# Patient Record
Sex: Female | Born: 1960
Health system: Southern US, Community
[De-identification: ages and names within clinical notes are randomized; demographics above are authoritative.]

## PROBLEM LIST (undated history)

## (undated) DIAGNOSIS — T7840XA Allergy, unspecified, initial encounter: Secondary | ICD-10-CM

## (undated) DIAGNOSIS — E785 Hyperlipidemia, unspecified: Secondary | ICD-10-CM

## (undated) DIAGNOSIS — M069 Rheumatoid arthritis, unspecified: Secondary | ICD-10-CM

## (undated) DIAGNOSIS — B019 Varicella without complication: Secondary | ICD-10-CM

## (undated) DIAGNOSIS — I82409 Acute embolism and thrombosis of unspecified deep veins of unspecified lower extremity: Secondary | ICD-10-CM

## (undated) DIAGNOSIS — K219 Gastro-esophageal reflux disease without esophagitis: Secondary | ICD-10-CM

## (undated) DIAGNOSIS — I10 Essential (primary) hypertension: Secondary | ICD-10-CM

## (undated) DIAGNOSIS — I2699 Other pulmonary embolism without acute cor pulmonale: Secondary | ICD-10-CM

## (undated) HISTORY — PX: KNEE ARTHROSCOPY: SUR90

## (undated) HISTORY — DX: Allergy, unspecified, initial encounter: T78.40XA

## (undated) HISTORY — DX: Gastro-esophageal reflux disease without esophagitis: K21.9

## (undated) HISTORY — PX: OTHER SURGICAL HISTORY: SHX169

## (undated) HISTORY — DX: Hyperlipidemia, unspecified: E78.5

## (undated) HISTORY — DX: Rheumatoid arthritis, unspecified: M06.9

## (undated) HISTORY — PX: ROTATOR CUFF REPAIR: SHX139

## (undated) HISTORY — DX: Essential (primary) hypertension: I10

## (undated) HISTORY — DX: Other pulmonary embolism without acute cor pulmonale: I26.99

## (undated) HISTORY — DX: Acute embolism and thrombosis of unspecified deep veins of unspecified lower extremity: I82.409

## (undated) HISTORY — DX: Varicella without complication: B01.9

---

## 2001-05-29 HISTORY — PX: FOOT SURGERY: SHX648

## 2009-05-29 DIAGNOSIS — I2699 Other pulmonary embolism without acute cor pulmonale: Secondary | ICD-10-CM

## 2009-05-29 DIAGNOSIS — I82409 Acute embolism and thrombosis of unspecified deep veins of unspecified lower extremity: Secondary | ICD-10-CM

## 2009-05-29 HISTORY — DX: Acute embolism and thrombosis of unspecified deep veins of unspecified lower extremity: I82.409

## 2009-05-29 HISTORY — DX: Other pulmonary embolism without acute cor pulmonale: I26.99

## 2015-10-07 ENCOUNTER — Ambulatory Visit (INDEPENDENT_AMBULATORY_CARE_PROVIDER_SITE_OTHER): Payer: 59 | Admitting: Internal Medicine

## 2015-10-07 ENCOUNTER — Encounter: Payer: Self-pay | Admitting: Internal Medicine

## 2015-10-07 VITALS — BP 120/76 | HR 71 | Temp 97.9°F | Ht 62.5 in | Wt 245.0 lb

## 2015-10-07 DIAGNOSIS — M0579 Rheumatoid arthritis with rheumatoid factor of multiple sites without organ or systems involvement: Secondary | ICD-10-CM

## 2015-10-07 DIAGNOSIS — I10 Essential (primary) hypertension: Secondary | ICD-10-CM

## 2015-10-07 DIAGNOSIS — K219 Gastro-esophageal reflux disease without esophagitis: Secondary | ICD-10-CM | POA: Diagnosis not present

## 2015-10-07 DIAGNOSIS — J302 Other seasonal allergic rhinitis: Secondary | ICD-10-CM | POA: Diagnosis not present

## 2015-10-07 DIAGNOSIS — Z86718 Personal history of other venous thrombosis and embolism: Secondary | ICD-10-CM | POA: Insufficient documentation

## 2015-10-07 DIAGNOSIS — Z86711 Personal history of pulmonary embolism: Secondary | ICD-10-CM

## 2015-10-07 DIAGNOSIS — E785 Hyperlipidemia, unspecified: Secondary | ICD-10-CM

## 2015-10-07 MED ORDER — METHOCARBAMOL 500 MG PO TABS: 500.0000 mg | ORAL_TABLET | Freq: Two times a day (BID) | ORAL | Status: DC

## 2015-10-07 MED ORDER — FOLIC ACID 1 MG PO TABS: 1.0000 mg | ORAL_TABLET | Freq: Every day | ORAL | Status: DC

## 2015-10-07 MED ORDER — CELECOXIB 200 MG PO CAPS: 200.0000 mg | ORAL_CAPSULE | Freq: Every day | ORAL | Status: DC

## 2015-10-07 MED ORDER — METHOTREXATE 2.5 MG PO TABS: 7.5000 mg | ORAL_TABLET | ORAL | Status: DC

## 2015-10-07 NOTE — Patient Instructions (Signed)

## 2015-10-07 NOTE — Assessment & Plan Note (Signed)
She takes Xarelto prn for travel

## 2015-10-07 NOTE — Progress Notes (Signed)
Pre visit review using our clinic review tool, if applicable. No additional management support is needed unless otherwise documented below in the visit note. 

## 2015-10-07 NOTE — Assessment & Plan Note (Signed)
Encouraged her to consume a low fat diet  She had lipid profile checked 04/2015- will ask for copy from previous PCP

## 2015-10-07 NOTE — Assessment & Plan Note (Signed)
She takes Xarelto prn with travel

## 2015-10-07 NOTE — Assessment & Plan Note (Signed)
Celebrex, Methotrexate and Robaxin refilled today She will continue Folic Acid and Enbrel as prescribed Referral to rheum for continuing treatment

## 2015-10-07 NOTE — Assessment & Plan Note (Signed)
Encouraged her to work on diet and exercise 

## 2015-10-07 NOTE — Assessment & Plan Note (Signed)
Controlled on Atenolol and Amlodipine

## 2015-10-07 NOTE — Assessment & Plan Note (Signed)
Currently not an issue Will monitor 

## 2015-10-07 NOTE — Progress Notes (Signed)
HPI  Pt presents to the clinic today to establish care and for management of the conditions listed below. She is transferring care from Dr. Charna Elizabeth, in [place].  Rheumatoid Arthritis: She takes Celebrex and Methotrexate daily. She takes Enbrel injections weekly. She is also taking Folic Acid and Robaxin as instructed. She will need referral to a rheumatologist in the area.  GERD: Currently not an issue.  Seasonal Allergies: Usually worse in the fall. She does not take anything consistently for her allergies.  HTN: Her BP is well controlled on Atenolol and Amlodipine. Her BP today is.  HLD: She denies myalgias on Lipitor. Her cholesterol was last checked. She does try to consume a low fat diet.  History of DVT and PE: She is taking Xarelto as needed for travel. She did do a coagulapathy workup, no disorder noted. She denies any s/s of bleeding. She denies leg swelling, redness, warmth of pain. She denies difficulty breathing or shortness of breath.  Flu: 02/2015 Tetanus: 2012 Pap Smear: 2015 Mammogram: 2015 Colon Screening: 2013 Vision Screening: 04/2015 Dentist: annually  Past Medical History  Diagnosis Date  . Rheumatoid arthritis (HCC)   . Chicken pox   . GERD (gastroesophageal reflux disease)   . Allergy   . Hyperlipidemia   . Hypertension   . DVT (deep venous thrombosis) (HCC) 2011  . PE (pulmonary embolism) 2011   Outpatient Encounter Prescriptions as of 10/07/2015  Medication Sig Note  . amLODipine (NORVASC) 5 MG tablet Take 5 mg by mouth daily. 10/07/2015: Received from: External Pharmacy  . atenolol (TENORMIN) 50 MG tablet Take 50 mg by mouth daily. 10/07/2015: Received from: External Pharmacy  . atorvastatin (LIPITOR) 10 MG tablet Take 10 mg by mouth daily. 10/07/2015: Received from: External Pharmacy  . celecoxib (CELEBREX) 200 MG capsule Take 200 mg by mouth daily. 10/07/2015: Received from: External Pharmacy  . etanercept (ENBREL SURECLICK) 50 MG/ML injection  Inject 50 mg into the skin once a week.   . folic acid (FOLVITE) 1 MG tablet Take 1 mg by mouth daily.   . methocarbamol (ROBAXIN) 500 MG tablet Take 500 mg by mouth 2 (two) times daily.   . methotrexate (RHEUMATREX) 2.5 MG tablet Take 7.5 mg by mouth once a week. Caution:Chemotherapy. Protect from light.   Carlena Hurl 20 MG TABS tablet Take 20 mg by mouth daily. On during travel 10/07/2015: Received from: External Pharmacy   No facility-administered encounter medications on file as of 10/07/2015.     Allergies  Allergen Reactions  . Azithromycin Nausea Only  . Sulfa Antibiotics Hives  . Typhoid Vaccines Other (See Comments)    Pain, inflammation     Family History  Problem Relation Age of Onset  . Arthritis Mother   . Hyperlipidemia Mother   . Hyperlipidemia Father   . Heart disease Father   . Stroke Father   . Diabetes Father   . Hypertension Brother   . Prostate cancer Maternal Uncle   . Heart disease Paternal Aunt   . Stroke Paternal Aunt   . Stroke Paternal Uncle   . Hyperlipidemia Maternal Grandmother   . Hyperlipidemia Maternal Grandfather   . Heart disease Maternal Grandfather   . Stroke Maternal Grandfather   . Hypertension Maternal Grandfather   . Hyperlipidemia Paternal Grandmother   . Diabetes Paternal Grandmother   . Hyperlipidemia Paternal Grandfather     Social History   Social History  . Marital Status: Single    Spouse Name: N/A  . Number of  Children: N/A  . Years of Education: N/A   Occupational History  . Not on file.   Social History Main Topics  . Smoking status: Never Smoker   . Smokeless tobacco: Not on file  . Alcohol Use: Not on file  . Drug Use: Not on file  . Sexual Activity: Not on file   Other Topics Concern  . Not on file   Social History Narrative  . No narrative on file    ROS:  Constitutional: Denies fever, malaise, fatigue, headache or abrupt weight changes.  HEENT: Denies eye pain, eye redness, ear pain, ringing in  the ears, wax buildup, runny nose, nasal congestion, bloody nose, or sore throat. Respiratory: Denies difficulty breathing, shortness of breath, cough or sputum production.   Cardiovascular: Denies chest pain, chest tightness, palpitations or swelling in the hands or feet.  Gastrointestinal: Denies abdominal pain, bloating, constipation, diarrhea or blood in the stool.  GU: Denies frequency, urgency, pain with urination, blood in urine, odor or discharge. Musculoskeletal: Pt reports joint pain and swelling. Denies decrease in range of motion, difficulty with gait, muscle pain.  Skin: Denies redness, rashes, lesions or ulcercations.  Neurological: Denies dizziness, difficulty with memory, difficulty with speech or problems with balance and coordination.  Psych: Denies anxiety, depression, SI/HI.  No other specific complaints in a complete review of systems (except as listed in HPI above).  PE:  BP 120/76 mmHg  Pulse 71  Temp(Src) 97.9 F (36.6 C) (Oral)  Ht 5' 2.5" (1.588 m)  Wt 245 lb (111.131 kg)  BMI 44.07 kg/m2  SpO2 98%  Wt Readings from Last 3 Encounters:  10/07/15 245 lb (111.131 kg)    General: Appears her stated age, obese in NAD. HEENT: Head: normal shape and size; Eyes: sclera white, no icterus, conjunctiva pink, PERRLA and EOMs intact; Ears: Tm's gray and intact, normal light reflex; Throat/Mouth: Teeth present, mucosa pink and moist, no lesions or ulcerations noted.  Cardiovascular: Normal rate and rhythm. S1,S2 noted.  No murmur, rubs or gallops noted.  Pulmonary/Chest: Normal effort and positive vesicular breath sounds. No respiratory distress. No wheezes, rales or ronchi noted.  Abdomen: Soft and nontender.  Musculoskeletal: Strength 5/5 BUE/BLE. No signs of joint swelling. No difficulty with gait.  Neurological: Alert and oriented.  Psychiatric: Mood and affect normal. Behavior is normal. Judgment and thought content normal.     Assessment and Plan:

## 2015-10-12 ENCOUNTER — Telehealth: Payer: Self-pay

## 2015-10-12 NOTE — Telephone Encounter (Signed)
Pt will be traveling to [place] and wants to ck on immunizations; pt should ck with health dept travel advisory or CDC. Left v/m requesting pt to cb.

## 2015-11-16 ENCOUNTER — Other Ambulatory Visit: Payer: Self-pay | Admitting: Internal Medicine

## 2015-12-21 ENCOUNTER — Other Ambulatory Visit: Payer: Self-pay

## 2015-12-21 MED ORDER — ATORVASTATIN CALCIUM 10 MG PO TABS: 10.0000 mg | ORAL_TABLET | Freq: Every day | ORAL | 1 refills | Status: DC

## 2016-01-02 ENCOUNTER — Other Ambulatory Visit: Payer: Self-pay | Admitting: Internal Medicine

## 2016-01-02 DIAGNOSIS — M0579 Rheumatoid arthritis with rheumatoid factor of multiple sites without organ or systems involvement: Secondary | ICD-10-CM

## 2016-01-04 NOTE — Telephone Encounter (Signed)
Are you going to continue to refill the Methotrexate?please advise

## 2016-01-05 NOTE — Telephone Encounter (Signed)
I referred her to Dr. Nickola Major, this should be filled by her

## 2016-03-28 ENCOUNTER — Other Ambulatory Visit: Payer: Self-pay | Admitting: Internal Medicine

## 2016-03-28 DIAGNOSIS — M0579 Rheumatoid arthritis with rheumatoid factor of multiple sites without organ or systems involvement: Secondary | ICD-10-CM

## 2016-03-28 NOTE — Telephone Encounter (Signed)
Electronic refill request. Last Filled:   Folvite  90 tablet 1 10/07/2015  Last Filled:   Robaxin  180 tablet 1 10/07/2015  Last office visit:   10/07/15  NP   Please advise.

## 2016-04-02 ENCOUNTER — Other Ambulatory Visit: Payer: Self-pay | Admitting: Internal Medicine

## 2016-04-02 DIAGNOSIS — M0579 Rheumatoid arthritis with rheumatoid factor of multiple sites without organ or systems involvement: Secondary | ICD-10-CM

## 2016-04-03 NOTE — Telephone Encounter (Signed)
Received refill electronically Last refill 10/07/15 #90/1 Last office visit same date See allergy/contraindication

## 2016-04-04 NOTE — Telephone Encounter (Signed)
Spoken to patient and she stated that she has recently had an appt with [facility]

## 2016-05-01 ENCOUNTER — Other Ambulatory Visit: Payer: Self-pay | Admitting: Primary Care

## 2016-05-01 DIAGNOSIS — M0579 Rheumatoid arthritis with rheumatoid factor of multiple sites without organ or systems involvement: Secondary | ICD-10-CM

## 2016-05-01 NOTE — Telephone Encounter (Signed)
Ok to refill? Electronically refill request for   celecoxib (CELEBREX) 200 MG capsule  Last prescribed by Jae Dire on 04/03/2016. Regina's patient. Last seen on 10/07/2015.

## 2016-05-17 ENCOUNTER — Ambulatory Visit (INDEPENDENT_AMBULATORY_CARE_PROVIDER_SITE_OTHER): Payer: 59 | Admitting: Internal Medicine

## 2016-05-17 ENCOUNTER — Encounter: Payer: Self-pay | Admitting: Internal Medicine

## 2016-05-17 VITALS — BP 118/78 | HR 58 | Temp 97.9°F | Ht 62.5 in | Wt 256.0 lb

## 2016-05-17 DIAGNOSIS — M25511 Pain in right shoulder: Secondary | ICD-10-CM | POA: Diagnosis not present

## 2016-05-17 DIAGNOSIS — Z1231 Encounter for screening mammogram for malignant neoplasm of breast: Secondary | ICD-10-CM

## 2016-05-17 DIAGNOSIS — Z0000 Encounter for general adult medical examination without abnormal findings: Secondary | ICD-10-CM | POA: Diagnosis not present

## 2016-05-17 DIAGNOSIS — Z9889 Other specified postprocedural states: Secondary | ICD-10-CM

## 2016-05-17 DIAGNOSIS — Z23 Encounter for immunization: Secondary | ICD-10-CM | POA: Diagnosis not present

## 2016-05-17 DIAGNOSIS — Z1239 Encounter for other screening for malignant neoplasm of breast: Secondary | ICD-10-CM

## 2016-05-17 DIAGNOSIS — H6123 Impacted cerumen, bilateral: Secondary | ICD-10-CM | POA: Diagnosis not present

## 2016-05-17 LAB — COMPREHENSIVE METABOLIC PANEL
ALT: 26 U/L (ref 0–35)
AST: 24 U/L (ref 0–37)
Albumin: 4 g/dL (ref 3.5–5.2)
Alkaline Phosphatase: 63 U/L (ref 39–117)
BUN: 13 mg/dL (ref 6–23)
CALCIUM: 9.5 mg/dL (ref 8.4–10.5)
CHLORIDE: 104 meq/L (ref 96–112)
CO2: 29 meq/L (ref 19–32)
Creatinine, Ser: 0.91 mg/dL (ref 0.40–1.20)
GFR: 82.54 mL/min (ref >60.00)
GLUCOSE: 87 mg/dL (ref 70–99)
Potassium: 4.4 mEq/L (ref 3.5–5.1)
Sodium: 141 mEq/L (ref 135–145)
Total Bilirubin: 0.5 mg/dL (ref 0.2–1.2)
Total Protein: 7.9 g/dL (ref 6.0–8.3)

## 2016-05-17 LAB — CBC
HCT: 45.6 % (ref 36.0–46.0)
HEMOGLOBIN: 15.2 g/dL — AB (ref 12.0–15.0)
MCHC: 33.3 g/dL (ref 30.0–36.0)
MCV: 92.7 fl (ref 78.0–100.0)
PLATELETS: 249 10*3/uL (ref 150.0–400.0)
RBC: 4.92 Mil/uL (ref 3.87–5.11)
RDW: 14.4 % (ref 11.5–15.5)
WBC: 4.7 10*3/uL (ref 4.0–10.5)

## 2016-05-17 LAB — TSH: TSH: 1.67 u[IU]/mL (ref 0.35–4.50)

## 2016-05-17 LAB — LIPID PANEL
CHOLESTEROL: 218 mg/dL — AB (ref 0–200)
HDL: 85.2 mg/dL (ref >39.00)
LDL CALC: 122 mg/dL — AB (ref 0–99)
NonHDL: 132.45
TRIGLYCERIDES: 54 mg/dL (ref 0.0–149.0)
Total CHOL/HDL Ratio: 3
VLDL: 10.8 mg/dL (ref 0.0–40.0)

## 2016-05-17 LAB — HEMOGLOBIN A1C: Hgb A1c MFr Bld: 5.9 % (ref 4.6–6.5)

## 2016-05-17 NOTE — Patient Instructions (Signed)

## 2016-05-17 NOTE — Progress Notes (Deleted)
Subjective:    Patient ID: Angel Lee, female    DOB: [birth year], 55 y.o.   MRN: [id number]  HPI  Pt presents to the clinic today for his annual exam.  Flu: 02/2015 Tetanus: 2012 Pap Smear: 2015 Mammogram: 2015 Colon Screening: 2013 Vision Screening: 04/2015 Dentist: annually  Diet: Exercise:  Review of Systems      Past Medical History:  Diagnosis Date  . Allergy   . Chicken pox   . DVT (deep venous thrombosis) (HCC) 2011  . GERD (gastroesophageal reflux disease)   . Hyperlipidemia   . Hypertension   . PE (pulmonary embolism) 2011  . Rheumatoid arthritis (HCC)     Current Outpatient Prescriptions  Medication Sig Dispense Refill  . amLODipine (NORVASC) 5 MG tablet Take 5 mg by mouth daily.  3  . atenolol (TENORMIN) 50 MG tablet Take 50 mg by mouth daily.  2  . atorvastatin (LIPITOR) 10 MG tablet Take 1 tablet (10 mg total) by mouth daily. 90 tablet 1  . celecoxib (CELEBREX) 200 MG capsule TAKE ONE CAPSULE BY MOUTH EVERY DAY 30 capsule 4  . etanercept (ENBREL SURECLICK) 50 MG/ML injection Inject 50 mg into the skin once a week.    . folic acid (FOLVITE) 1 MG tablet TAKE 1 TABLET (1 MG TOTAL) BY MOUTH DAILY. 90 tablet 1  . methocarbamol (ROBAXIN) 500 MG tablet TAKE 1 TABLET (500 MG TOTAL) BY MOUTH 2 (TWO) TIMES DAILY. 180 tablet 1  . methotrexate (RHEUMATREX) 2.5 MG tablet Take 3 tablets (7.5 mg total) by mouth once a week. Caution:Chemotherapy. Protect from light. 36 tablet 1  . XARELTO 20 MG TABS tablet Take 20 mg by mouth daily. On during travel  1   No current facility-administered medications for this visit.     Allergies  Allergen Reactions  . Azithromycin Nausea Only  . Sulfa Antibiotics Hives  . Typhoid Vaccines Other (See Comments)    Pain, inflammation     Family History  Problem Relation Age of Onset  . Arthritis Mother   . Hyperlipidemia Mother   . Hyperlipidemia Father   . Heart disease Father   . Stroke Father   . Diabetes Father   .  Hypertension Brother   . Prostate cancer Maternal Uncle   . Heart disease Paternal Aunt   . Stroke Paternal Aunt   . Stroke Paternal Uncle   . Hyperlipidemia Maternal Grandmother   . Hyperlipidemia Maternal Grandfather   . Heart disease Maternal Grandfather   . Stroke Maternal Grandfather   . Hypertension Maternal Grandfather   . Hyperlipidemia Paternal Grandmother   . Diabetes Paternal Grandmother   . Hyperlipidemia Paternal Grandfather     Social History   Social History  . Marital status: Single    Spouse name: N/A  . Number of children: N/A  . Years of education: N/A   Occupational History  . Not on file.   Social History Main Topics  . Smoking status: Never Smoker  . Smokeless tobacco: Not on file  . Alcohol use Not on file  . Drug use: Unknown  . Sexual activity: Not on file   Other Topics Concern  . Not on file   Social History Narrative  . No narrative on file     Constitutional: Denies fever, malaise, fatigue, headache or abrupt weight changes.  HEENT: Denies eye pain, eye redness, ear pain, ringing in the ears, wax buildup, runny nose, nasal congestion, bloody nose, or sore throat. Respiratory: Denies difficulty breathing,  shortness of breath, cough or sputum production.   Cardiovascular: Denies chest pain, chest tightness, palpitations or swelling in the hands or feet.  Gastrointestinal: Denies abdominal pain, bloating, constipation, diarrhea or blood in the stool.  GU: Denies urgency, frequency, pain with urination, burning sensation, blood in urine, odor or discharge. Musculoskeletal: Denies decrease in range of motion, difficulty with gait, muscle pain or joint pain and swelling.  Skin: Denies redness, rashes, lesions or ulcercations.  Neurological: Denies dizziness, difficulty with memory, difficulty with speech or problems with balance and coordination.  Psych: Denies anxiety, depression, SI/HI.  No other specific complaints in a complete review of  systems (except as listed in HPI above).  Objective:   Physical Exam        Assessment & Plan:

## 2016-05-17 NOTE — Progress Notes (Signed)
Subjective:    Patient ID: Angel Lee, female    DOB: [birth year], 55 y.o.   MRN: [id number]  HPI  Pt presents to the clinic today for her annual exam.  Flu: 02/2015 Tetanus: 2012 Pap Smear: 2015 Mammogram: 2015 Colon Screening: 2013 Vision Screening: 04/2015,every 2 years Dentist: annually  Diet: She does eat meat. She consumes fruits and veggies most days of the week. She does eat some fried food. She drinks mostly water, 1 soda daily. Exercise: She is getting her steps in daily, enough to walk 1-2 miles, but not with an elevated heart rate.   Review of Systems      Past Medical History:  Diagnosis Date  . Allergy   . Chicken pox   . DVT (deep venous thrombosis) (HCC) 2011  . GERD (gastroesophageal reflux disease)   . Hyperlipidemia   . Hypertension   . PE (pulmonary embolism) 2011  . Rheumatoid arthritis (HCC)     Current Outpatient Prescriptions  Medication Sig Dispense Refill  . amLODipine (NORVASC) 5 MG tablet Take 5 mg by mouth daily.  3  . atenolol (TENORMIN) 50 MG tablet Take 50 mg by mouth daily.  2  . atorvastatin (LIPITOR) 10 MG tablet Take 1 tablet (10 mg total) by mouth daily. 90 tablet 1  . celecoxib (CELEBREX) 200 MG capsule TAKE ONE CAPSULE BY MOUTH EVERY DAY 30 capsule 4  . etanercept (ENBREL SURECLICK) 50 MG/ML injection Inject 50 mg into the skin once a week.    . folic acid (FOLVITE) 1 MG tablet TAKE 1 TABLET (1 MG TOTAL) BY MOUTH DAILY. 90 tablet 1  . methocarbamol (ROBAXIN) 500 MG tablet TAKE 1 TABLET (500 MG TOTAL) BY MOUTH 2 (TWO) TIMES DAILY. 180 tablet 1  . methotrexate (RHEUMATREX) 2.5 MG tablet Take 3 tablets (7.5 mg total) by mouth once a week. Caution:Chemotherapy. Protect from light. 36 tablet 1  . XARELTO 20 MG TABS tablet Take 20 mg by mouth daily. On during travel  1   No current facility-administered medications for this visit.     Allergies  Allergen Reactions  . Azithromycin Nausea Only  . Sulfa Antibiotics Hives  .  Typhoid Vaccines Other (See Comments)    Pain, inflammation     Family History  Problem Relation Age of Onset  . Arthritis Mother   . Hyperlipidemia Mother   . Hyperlipidemia Father   . Heart disease Father   . Stroke Father   . Diabetes Father   . Hypertension Brother   . Prostate cancer Maternal Uncle   . Heart disease Paternal Aunt   . Stroke Paternal Aunt   . Stroke Paternal Uncle   . Hyperlipidemia Maternal Grandmother   . Hyperlipidemia Maternal Grandfather   . Heart disease Maternal Grandfather   . Stroke Maternal Grandfather   . Hypertension Maternal Grandfather   . Hyperlipidemia Paternal Grandmother   . Diabetes Paternal Grandmother   . Hyperlipidemia Paternal Grandfather     Social History   Social History  . Marital status: Single    Spouse name: N/A  . Number of children: N/A  . Years of education: N/A   Occupational History  . Not on file.   Social History Main Topics  . Smoking status: Never Smoker  . Smokeless tobacco: Never Used  . Alcohol use No  . Drug use: No  . Sexual activity: Not on file   Other Topics Concern  . Not on file   Social History Narrative  .  No narrative on file     Constitutional: Pt reports weight gain. Denies fever, malaise, fatigue, headache.  HEENT: Pt reports wax buildup. Denies eye pain, eye redness, ear pain, ringing in the ears, runny nose, nasal congestion, bloody nose, or sore throat. Respiratory: Denies difficulty breathing, shortness of breath, cough or sputum production.   Cardiovascular: Denies chest pain, chest tightness, palpitations or swelling in the hands or feet.  Gastrointestinal: Pt reports intermittent reflux. Denies abdominal pain, bloating, constipation, diarrhea or blood in the stool.  GU: Denies urgency, frequency, pain with urination, burning sensation, blood in urine, odor or discharge. Musculoskeletal: Pt reports right shoulder pain and weakness. Denies decrease in range of motion, difficulty  with gait, muscle pain or joint swelling.  Skin: Denies redness, rashes, lesions or ulcercations.  Neurological: Denies dizziness, difficulty with memory, difficulty with speech or problems with balance and coordination.  Psych: Denies anxiety, depression, SI/HI.  No other specific complaints in a complete review of systems (except as listed in HPI above).  Objective:   Physical Exam   BP 118/78   Pulse (!) 58   Temp 97.9 F (36.6 C) (Oral)   Ht 5' 2.5" (1.588 m)   Wt 256 lb (116.1 kg)   SpO2 98%   BMI 46.08 kg/m  Wt Readings from Last 3 Encounters:  05/17/16 256 lb (116.1 kg)  10/07/15 245 lb (111.1 kg)    General: Appears her stated age, obese in NAD. Skin: Warm, dry and intact. No rashes, lesions or ulcerations noted. HEENT: Head: normal shape and size; Eyes: sclera white, no icterus, conjunctiva pink, PERRLA and EOMs intact; Ears: Bilateral cerumen impaction; Throat/Mouth: Teeth present, mucosa pink and moist, no exudate, lesions or ulcerations noted.  Neck:  Neck supple, trachea midline. No masses, lumps or thyromegaly present.  Cardiovascular: Normal rate and rhythm. S1,S2 noted.  No murmur, rubs or gallops noted. No JVD or BLE edema.  Pulmonary/Chest: Normal effort and positive vesicular breath sounds. No respiratory distress. No wheezes, rales or ronchi noted.  Abdomen: Soft and nontender. Normal bowel sounds. No distention or masses noted. Liver, spleen and kidneys non palpable due to size. Musculoskeletal: Normal internal and external rotation of the right shoulder. Normal adduction and abduction. No joint swelling noted. Positive drop can test on the right. Strength 4/5 RUE/LUE. Neurological: Alert and oriented.  Psychiatric: Mood and affect normal. Behavior is normal. Judgment and thought content normal.       Assessment & Plan:   Preventative Health Maintenance:  Flu shot today Tetanus UTD Pap Smear due 2020 Mammogram ordered, she will call [facility] to schedule,  number provided Colon Screening UTD Encouraged her to consume a balanced diet and exercise regimen Advised her to to see an eye doctor and dentist annually Will check CBC, CMET, Lipid, TSH and A1C today  Bilateral Cerumen Impaction:  Manual lavage by CMA Advised her to try Debrox OTC 2 x week to prevent wax buildup  Right shoulder pain with history of previous rotator cuff repair:  Referral to ortho for further evaluation and treatment  RTC in 1 year, sooner if needed Nicki Reaper, NP

## 2016-05-19 MED ORDER — ATORVASTATIN CALCIUM 20 MG PO TABS: 20.0000 mg | ORAL_TABLET | Freq: Every day | ORAL | 1 refills | Status: DC

## 2016-05-19 NOTE — Addendum Note (Signed)
Addended by: Roena Malady on: 05/19/2016 09:26 AM   Modules accepted: Orders

## 2016-06-06 DIAGNOSIS — M25511 Pain in right shoulder: Secondary | ICD-10-CM | POA: Diagnosis not present

## 2016-06-13 NOTE — Addendum Note (Signed)
Addended by: Roena Malady on: 06/13/2016 04:28 PM   Modules accepted: Orders

## 2016-06-20 NOTE — Telephone Encounter (Signed)
Pt was seen 05/17/16 for annual.

## 2016-06-29 DIAGNOSIS — M0609 Rheumatoid arthritis without rheumatoid factor, multiple sites: Secondary | ICD-10-CM | POA: Diagnosis not present

## 2016-06-29 DIAGNOSIS — M2550 Pain in unspecified joint: Secondary | ICD-10-CM | POA: Diagnosis not present

## 2016-06-29 DIAGNOSIS — M150 Primary generalized (osteo)arthritis: Secondary | ICD-10-CM | POA: Diagnosis not present

## 2016-06-29 DIAGNOSIS — Z79899 Other long term (current) drug therapy: Secondary | ICD-10-CM | POA: Diagnosis not present

## 2016-07-24 ENCOUNTER — Other Ambulatory Visit: Payer: Self-pay | Admitting: Internal Medicine

## 2016-08-10 ENCOUNTER — Emergency Department (HOSPITAL_COMMUNITY)
Admission: EM | Admit: 2016-08-10 | Discharge: 2016-08-10 | Disposition: A | Discharge status: Home / Self Care | Payer: 59 | Attending: Emergency Medicine | Admitting: Emergency Medicine

## 2016-08-10 ENCOUNTER — Emergency Department (HOSPITAL_BASED_OUTPATIENT_CLINIC_OR_DEPARTMENT_OTHER): Payer: 59

## 2016-08-10 DIAGNOSIS — I10 Essential (primary) hypertension: Secondary | ICD-10-CM | POA: Insufficient documentation

## 2016-08-10 DIAGNOSIS — M79609 Pain in unspecified limb: Secondary | ICD-10-CM

## 2016-08-10 DIAGNOSIS — M0609 Rheumatoid arthritis without rheumatoid factor, multiple sites: Secondary | ICD-10-CM | POA: Diagnosis not present

## 2016-08-10 DIAGNOSIS — M79604 Pain in right leg: Secondary | ICD-10-CM

## 2016-08-10 DIAGNOSIS — M79661 Pain in right lower leg: Secondary | ICD-10-CM | POA: Insufficient documentation

## 2016-08-10 DIAGNOSIS — M25561 Pain in right knee: Secondary | ICD-10-CM | POA: Diagnosis not present

## 2016-08-10 LAB — BASIC METABOLIC PANEL
ANION GAP: 8 (ref 5–15)
BUN: 11 mg/dL (ref 6–20)
CHLORIDE: 105 mmol/L (ref 101–111)
CO2: 26 mmol/L (ref 22–32)
Calcium: 9.1 mg/dL (ref 8.9–10.3)
Creatinine, Ser: 0.99 mg/dL (ref 0.44–1.00)
GFR calc Af Amer: >60 mL/min (ref >60)
GFR calc non Af Amer: >60 mL/min (ref >60)
Glucose, Bld: 107 mg/dL — ABNORMAL HIGH (ref 65–99)
Potassium: 4.8 mmol/L (ref 3.5–5.1)
SODIUM: 139 mmol/L (ref 135–145)

## 2016-08-10 LAB — DIFFERENTIAL
Basophils Absolute: 0 10*3/uL (ref 0.0–0.1)
Basophils Relative: 0 %
EOS ABS: 0.1 10*3/uL (ref 0.0–0.7)
EOS PCT: 3 %
LYMPHS ABS: 2.1 10*3/uL (ref 0.7–4.0)
LYMPHS PCT: 48 %
MONO ABS: 0.4 10*3/uL (ref 0.1–1.0)
Monocytes Relative: 10 %
NEUTROS PCT: 39 %
Neutro Abs: 1.7 10*3/uL (ref 1.7–7.7)

## 2016-08-10 LAB — CBC
HEMATOCRIT: 43.6 % (ref 36.0–46.0)
Hemoglobin: 14 g/dL (ref 12.0–15.0)
MCH: 30.6 pg (ref 26.0–34.0)
MCHC: 32.1 g/dL (ref 30.0–36.0)
MCV: 95.4 fL (ref 78.0–100.0)
Platelets: 228 10*3/uL (ref 150–400)
RBC: 4.57 MIL/uL (ref 3.87–5.11)
RDW: 14.7 % (ref 11.5–15.5)
WBC: 4.3 10*3/uL (ref 4.0–10.5)

## 2016-08-10 NOTE — ED Provider Notes (Signed)
[facility]-EMERGENCY DEPT Provider Note   CSN: [id number] Arrival date & time: 08/10/16  0148     History   Chief Complaint No chief complaint on file.   HPI Angel Lee is a 56 y.o. female.  The history is provided by the patient.  Leg Pain   This is a recurrent problem. The current episode started 2 days ago. The problem occurs constantly. Progression since onset: fluctuating. The pain is present in the right lower leg. The quality of the pain is described as aching. Pertinent negatives include no numbness, full range of motion and no stiffness. She has tried nothing for the symptoms. There has been no history of extremity trauma. Family history is significant for rheumatoid arthritis.   Does have a h/o DVT/PE on Xarelto (only prior to travel as she travels frequently). Pain is similar to RA, but usually pain goes away. Since it's been on going for a few days, she is concerned it might be a DVT.   Past Medical History:  Diagnosis Date  . Allergy   . Chicken pox   . DVT (deep venous thrombosis) (HCC) 2011  . GERD (gastroesophageal reflux disease)   . Hyperlipidemia   . Hypertension   . PE (pulmonary embolism) 2011  . Rheumatoid arthritis [facility])     Patient Active Problem List   Diagnosis Date Noted  . Rheumatoid arthritis involving multiple sites with positive rheumatoid factor (HCC) 10/07/2015  . GERD (gastroesophageal reflux disease) 10/07/2015  . Seasonal allergies 10/07/2015  . HTN (hypertension) 10/07/2015  . HLD (hyperlipidemia) 10/07/2015  . History of DVT (deep vein thrombosis) 10/07/2015  . History of pulmonary embolism 10/07/2015  . Severe obesity (BMI >= 40) (HCC) 10/07/2015    Past Surgical History:  Procedure Laterality Date  . FOOT SURGERY Right 2003  . KNEE ARTHROSCOPY Bilateral 1981, 1998, 2007  . ROTATOR CUFF REPAIR Bilateral 2000, 2005  . wisdom teeth      OB History    No data available       Home Medications    Prior to Admission  medications   Medication Sig Start Date End Date Taking? Authorizing Provider  amLODipine (NORVASC) 5 MG tablet Take 1 tablet (5 mg total) by mouth daily. 07/25/16  Yes Lorre Munroe, NP  atenolol (TENORMIN) 50 MG tablet Take 50 mg by mouth daily. 07/10/15  Yes Historical Provider, MD  atorvastatin (LIPITOR) 20 MG tablet Take 1 tablet (20 mg total) by mouth daily. 05/19/16  Yes Lorre Munroe, NP  celecoxib (CELEBREX) 200 MG capsule TAKE ONE CAPSULE BY MOUTH EVERY DAY 05/01/16  Yes Lorre Munroe, NP  etanercept (ENBREL SURECLICK) 50 MG/ML injection Inject 50 mg into the skin once a week.   Yes Historical Provider, MD  folic acid (FOLVITE) 1 MG tablet TAKE 1 TABLET (1 MG TOTAL) BY MOUTH DAILY. 03/29/16  Yes Lorre Munroe, NP  methocarbamol (ROBAXIN) 500 MG tablet TAKE 1 TABLET (500 MG TOTAL) BY MOUTH 2 (TWO) TIMES DAILY. 03/29/16  Yes Lorre Munroe, NP  methotrexate (RHEUMATREX) 2.5 MG tablet Take 3 tablets (7.5 mg total) by mouth once a week. Caution:Chemotherapy. Protect from light. Patient taking differently: Take 15 mg by mouth once a week. Caution:Chemotherapy. Protect from light. 10/07/15  Yes Lorre Munroe, NP  ranitidine (ZANTAC) 150 MG tablet Take 150 mg by mouth every morning.   Yes Historical Provider, MD  XARELTO 20 MG TABS tablet Take 20 mg by mouth See admin instructions. On during travel 09/11/15  Yes Historical Provider, MD    Family History Family History  Problem Relation Age of Onset  . Arthritis Mother   . Hyperlipidemia Mother   . Hyperlipidemia Father   . Heart disease Father   . Stroke Father   . Diabetes Father   . Hypertension Brother   . Prostate cancer Maternal Uncle   . Heart disease Paternal Aunt   . Stroke Paternal Aunt   . Stroke Paternal Uncle   . Hyperlipidemia Maternal Grandmother   . Hyperlipidemia Maternal Grandfather   . Heart disease Maternal Grandfather   . Stroke Maternal Grandfather   . Hypertension Maternal Grandfather   . Hyperlipidemia  Paternal Grandmother   . Diabetes Paternal Grandmother   . Hyperlipidemia Paternal Grandfather     Social History Social History  Substance Use Topics  . Smoking status: Never Smoker  . Smokeless tobacco: Never Used  . Alcohol use No     Allergies   Azithromycin; Sulfa antibiotics; and Typhoid vaccines   Review of Systems Review of Systems  Musculoskeletal: Negative for stiffness.  Neurological: Negative for numbness.   Ten systems are reviewed and are negative for acute change except as noted in the HPI  Physical Exam Updated Vital Signs BP 138/83   Pulse 87   Resp 18   SpO2 96%   Physical Exam  Constitutional: She is oriented to person, place, and time. She appears well-developed and well-nourished. No distress.  HENT:  Head: Normocephalic and atraumatic.  Nose: Nose normal.  Eyes: Conjunctivae and EOM are normal. Pupils are equal, round, and reactive to light. Right eye exhibits no discharge. Left eye exhibits no discharge. No scleral icterus.  Neck: Normal range of motion. Neck supple.  Cardiovascular: Normal rate and regular rhythm.  Exam reveals no gallop and no friction rub.   No murmur heard. Pulmonary/Chest: Effort normal and breath sounds normal. No stridor. No respiratory distress. She has no rales.  Abdominal: Soft. She exhibits no distension. There is no tenderness.  Musculoskeletal: She exhibits no edema.       Right knee: Tenderness (mild discomfort to posterior aspect) found.  Neurological: She is alert and oriented to person, place, and time.  Skin: Skin is warm and dry. No rash noted. She is not diaphoretic. No erythema.  Psychiatric: She has a normal mood and affect.  Vitals reviewed.    ED Treatments / Results  Labs (all labs ordered are listed, but only abnormal results are displayed) Labs Reviewed  BASIC METABOLIC PANEL - Abnormal; Notable for the following:       Result Value   Glucose, Bld 107 (*)    All other components within normal  limits  CBC  DIFFERENTIAL  CBC WITH DIFFERENTIAL/PLATELET    EKG  EKG Interpretation None       Radiology No results found.  Procedures Procedures (including critical care time)  Medications Ordered in ED Medications - No data to display   Initial Impression / Assessment and Plan / ED Course  I have reviewed the triage vital signs and the nursing notes.  Pertinent labs & imaging results that were available during my care of the patient were reviewed by me and considered in my medical decision making (see chart for details).     No evidence of cellulitis. Labs reassuring. Will obtain US to assess for DVT. If negative, she is clear for discharge. If positive, she will need to take daily Xarelto and follow up with PCP.  Patient care turned over to  Dr Patria Mane at 0700. Patient case and results discussed in detail; please see their note for further ED managment.      Final Clinical Impressions(s) / ED Diagnoses   Final diagnoses:  Right leg pain     Nira Conn, MD 08/10/16 [phone]

## 2016-08-10 NOTE — ED Provider Notes (Signed)
VASCULAR LAB PRELIMINARY  PRELIMINARY  PRELIMINARY  PRELIMINARY  Right lower extremity venous duplex completed.    Preliminary report:  There is no DVT or SVT noted in the right lower extremity.  Called results to Dr. Orlean Bradford, [facility], RVT 08/10/2016, 9:01 AM   Azalia Bilis, MD 08/10/16 [phone]

## 2016-08-10 NOTE — ED Notes (Signed)
See downtime record

## 2016-08-10 NOTE — ED Notes (Signed)
Patient transported to Vascular Ultrasound °

## 2016-08-10 NOTE — Progress Notes (Signed)
VASCULAR LAB PRELIMINARY  PRELIMINARY  PRELIMINARY  PRELIMINARY  Right lower extremity venous duplex completed.    Preliminary report:  There is no DVT or SVT noted in the right lower extremity.  Called results to Dr. Orlean Bradford, [facility], RVT 08/10/2016, 9:01 AM

## 2016-08-15 ENCOUNTER — Ambulatory Visit (INDEPENDENT_AMBULATORY_CARE_PROVIDER_SITE_OTHER): Payer: 59 | Admitting: Internal Medicine

## 2016-08-15 ENCOUNTER — Encounter: Payer: Self-pay | Admitting: Internal Medicine

## 2016-08-15 VITALS — BP 106/68 | HR 72 | Temp 97.6°F | Wt 258.8 lb

## 2016-08-15 DIAGNOSIS — M79661 Pain in right lower leg: Secondary | ICD-10-CM

## 2016-08-15 NOTE — Progress Notes (Signed)
Subjective:    Patient ID: Angel Lee, female    DOB: [birth year], 56 y.o.   MRN: [id number]  HPI  Pt presents to the clinic today for ER follow up. She went to the ER with c/po right leg pain. She reports the pain started. She describes the pain as achy but varying in intensity. Given her history of DVT, they did a RLE doppler which was negative. She was discharged home and advised to follow up with her PCP. Since discharge, she reports she has had some improvement. She has tried stretching it. She has not noticed any swelling, pain or warmth. She continues to take Xarelto. She denies any injury to the area. She continues to have joint stiffness, but has a history of RA and is on MTX.  Review of Systems  Past Medical History:  Diagnosis Date  . Allergy   . Chicken pox   . DVT (deep venous thrombosis) (HCC) 2011  . GERD (gastroesophageal reflux disease)   . Hyperlipidemia   . Hypertension   . PE (pulmonary embolism) 2011  . Rheumatoid arthritis (HCC)     Current Outpatient Prescriptions  Medication Sig Dispense Refill  . amLODipine (NORVASC) 5 MG tablet Take 1 tablet (5 mg total) by mouth daily. 30 tablet 6  . atenolol (TENORMIN) 50 MG tablet Take 50 mg by mouth daily.  2  . atorvastatin (LIPITOR) 20 MG tablet Take 1 tablet (20 mg total) by mouth daily. 90 tablet 1  . celecoxib (CELEBREX) 200 MG capsule TAKE ONE CAPSULE BY MOUTH EVERY DAY 30 capsule 4  . etanercept (ENBREL SURECLICK) 50 MG/ML injection Inject 50 mg into the skin once a week.    . folic acid (FOLVITE) 1 MG tablet TAKE 1 TABLET (1 MG TOTAL) BY MOUTH DAILY. 90 tablet 1  . methocarbamol (ROBAXIN) 500 MG tablet TAKE 1 TABLET (500 MG TOTAL) BY MOUTH 2 (TWO) TIMES DAILY. 180 tablet 1  . methotrexate (RHEUMATREX) 2.5 MG tablet Take 3 tablets (7.5 mg total) by mouth once a week. Caution:Chemotherapy. Protect from light. (Patient taking differently: Take 15 mg by mouth once a week. Caution:Chemotherapy. Protect from light.)  36 tablet 1  . ranitidine (ZANTAC) 150 MG tablet Take 150 mg by mouth every morning.    Carlena Hurl 20 MG TABS tablet Take 20 mg by mouth See admin instructions. On during travel  1   No current facility-administered medications for this visit.     Allergies  Allergen Reactions  . Azithromycin Nausea Only  . Sulfa Antibiotics Hives  . Typhoid Vaccines Other (See Comments)    Pain, inflammation     Family History  Problem Relation Age of Onset  . Arthritis Mother   . Hyperlipidemia Mother   . Hyperlipidemia Father   . Heart disease Father   . Stroke Father   . Diabetes Father   . Hypertension Brother   . Prostate cancer Maternal Uncle   . Heart disease Paternal Aunt   . Stroke Paternal Aunt   . Stroke Paternal Uncle   . Hyperlipidemia Maternal Grandmother   . Hyperlipidemia Maternal Grandfather   . Heart disease Maternal Grandfather   . Stroke Maternal Grandfather   . Hypertension Maternal Grandfather   . Hyperlipidemia Paternal Grandmother   . Diabetes Paternal Grandmother   . Hyperlipidemia Paternal Grandfather     Social History   Social History  . Marital status: Single    Spouse name: N/A  . Number of children: N/A  . Years  of education: N/A   Occupational History  . Not on file.   Social History Main Topics  . Smoking status: Never Smoker  . Smokeless tobacco: Never Used  . Alcohol use No  . Drug use: No  . Sexual activity: Not on file   Other Topics Concern  . Not on file   Social History Narrative  . No narrative on file     Constitutional: Denies fever, malaise, fatigue, headache or abrupt weight changes.  Respiratory: Denies difficulty breathing, shortness of breath, cough or sputum production.   Cardiovascular: Denies chest pain, chest tightness, palpitations or swelling in the hands or feet.  Musculoskeletal: Pt reports right leg pain. Denies decrease in range of motion, difficulty with gait, or joint pain and swelling.  Skin: Denies  redness, rashes, lesions or ulcercations.   No other specific complaints in a complete review of systems (except as listed in HPI above).     Objective:   Physical Exam   BP 106/68   Pulse 72   Temp 97.6 F (36.4 C) (Oral)   Wt 258 lb 12 oz (117.4 kg)   SpO2 97%   BMI 46.57 kg/m  Wt Readings from Last 3 Encounters:  08/15/16 258 lb 12 oz (117.4 kg)  05/17/16 256 lb (116.1 kg)  10/07/15 245 lb (111.1 kg)    General: Appears her stated age, obese in NAD. Skin: Warm, dry and intact. No redness or warmth noted. Cardiovascular: Trace pitting edema BLE. Pulmonary/Chest: Normal effort and positive vesicular breath sounds. No respiratory distress. No wheezes, rales or ronchi noted.  Musculoskeletal: Pain with palpation of the right gastrocnemius. No difficulty with gait.    BMET    Component Value Date/Time   NA 139 08/10/2016 0215   K 4.8 08/10/2016 0215   CL 105 08/10/2016 0215   CO2 26 08/10/2016 0215   GLUCOSE 107 (H) 08/10/2016 0215   BUN 11 08/10/2016 0215   CREATININE 0.99 08/10/2016 0215   CALCIUM 9.1 08/10/2016 0215   GFRNONAA >60 08/10/2016 0215   GFRAA >60 08/10/2016 0215    Lipid Panel     Component Value Date/Time   CHOL 218 (H) 05/17/2016 1414   TRIG 54.0 05/17/2016 1414   HDL 85.20 05/17/2016 1414   CHOLHDL 3 05/17/2016 1414   VLDL 10.8 05/17/2016 1414   LDLCALC 122 (H) 05/17/2016 1414    CBC    Component Value Date/Time   WBC 4.3 08/10/2016 0215   RBC 4.57 08/10/2016 0215   HGB 14.0 08/10/2016 0215   HCT 43.6 08/10/2016 0215   PLT 228 08/10/2016 0215   MCV 95.4 08/10/2016 0215   MCH 30.6 08/10/2016 0215   MCHC 32.1 08/10/2016 0215   RDW 14.7 08/10/2016 0215   LYMPHSABS 2.1 08/10/2016 0215   MONOABS 0.4 08/10/2016 0215   EOSABS 0.1 08/10/2016 0215   BASOSABS 0.0 08/10/2016 0215    Hgb A1C Lab Results  Component Value Date   HGBA1C 5.9 05/17/2016           Assessment & Plan:   ER follow up for right leg pain:  ER notes,  labs and imaging reviewed Seems MSK in nature eRx for Aspercream Stretching exercises given A heating pad may be helpful  RTC as needed or if symptoms persist or worsen Darrly Loberg, NP

## 2016-08-15 NOTE — Patient Instructions (Signed)
Medial Head Gastrocnemius Tear Rehab Ask your health care provider which exercises are safe for you. Do exercises exactly as told by your health care provider and adjust them as directed. It is normal to feel mild stretching, pulling, tightness, or discomfort as you do these exercises, but you should stop right away if you feel sudden pain or your pain gets worse.Do not begin these exercises until told by your health care provider. Stretching and range of motion exercises These exercises warm up your muscles and joints and improve the movement and flexibility of your lower leg. These exercises also help to relieve pain and stiffness. Exercise A: Gastrocnemius stretch   1. Sit with your left / right leg extended. 2. Loop a belt or towel around the ball of your left / right foot. The ball of your foot is on the walking surface, right under your toes. 3. Hold both ends of the belt or towel. 4. Keep your left / right ankle and foot relaxed and keep your knee straight while you use the belt or towel to pull your foot and ankle toward you. Stop at the first point of resistance. 5. Hold this position for __________ seconds. Repeat __________ times. Complete this exercise __________ times a day. Exercise B: Ankle alphabet   1. Sit with your left / right leg supported at the lower leg.  Do not rest your foot on anything.  Make sure your foot has room to move freely. 2. Think of your left / right foot as a paintbrush, and move your foot to trace each letter of the alphabet in the air. Keep your hip and knee still while you trace. 3. Trace every letter from A to Z. Repeat __________ times. Complete this exercise __________ times a day. Strengthening exercises These exercises build strength and endurance in your lower leg. Endurance is the ability to use your muscles for a long time, even after they get tired. Exercise C: Plantar flexors with band   1. Sit with your left / right leg extended. 2. Loop  a rubber exercise band or tube around the ball of your left / right foot. The ball of your foot is on the walking surface, right under your toes. 3. While holding both ends of the band or tube, slowly point your toes downward, pushing them away from you. 4. Hold this position for __________ seconds. 5. Slowly return your foot to the starting position. Repeat __________ times. Complete this exercise __________ times a day. Exercise D: Plantar flexors, standing   1. Stand with your feet shoulder-width apart. 2. Place your hands on a wall or table to steady yourself as needed, but try not to use it very much for support. 3. Rise up on your toes. 4. If this exercise is too easy, try these options:  Shift your weight toward your left / right leg until you feel challenged.  If told by your health care provider, stand on your left / right foot only. 5. Hold this position for __________ seconds. Repeat __________ times. Complete this exercise __________ times a day. Exercise E: Plantar flexors, eccentric   1. Stand on the balls of your feet on the edge of a step. The ball of your foot is on the walking surface, right under your toes. 2. Place your hands on a wall or railing for balance as needed, but try not to lean on it for support. 3. Rise up on your toes, using both legs to help. 4. Slowly shift all   of your weight to your left / right foot and lift your other foot off the step. 5. Slowly lower your left / right heel so it drops below the level of the step. You will feel a slight stretch in your left / right calf. 6. Put your other foot back onto the step. Repeat __________ times. Complete this exercise __________ times a day. This information is not intended to replace advice given to you by your health care provider. Make sure you discuss any questions you have with your health care provider. Document Released: 05/15/2005 Document Revised: 01/20/2016 Document Reviewed: 04/27/2015 [employer]vier  Interactive Patient Education  2017 [employer]  

## 2016-08-31 DIAGNOSIS — M791 Myalgia: Secondary | ICD-10-CM | POA: Diagnosis not present

## 2016-08-31 DIAGNOSIS — M0609 Rheumatoid arthritis without rheumatoid factor, multiple sites: Secondary | ICD-10-CM | POA: Diagnosis not present

## 2016-08-31 DIAGNOSIS — M2550 Pain in unspecified joint: Secondary | ICD-10-CM | POA: Diagnosis not present

## 2016-08-31 DIAGNOSIS — M150 Primary generalized (osteo)arthritis: Secondary | ICD-10-CM | POA: Diagnosis not present

## 2016-09-13 ENCOUNTER — Other Ambulatory Visit: Payer: Self-pay

## 2016-09-13 DIAGNOSIS — M0579 Rheumatoid arthritis with rheumatoid factor of multiple sites without organ or systems involvement: Secondary | ICD-10-CM

## 2016-09-13 MED ORDER — CELECOXIB 200 MG PO CAPS: 200.0000 mg | ORAL_CAPSULE | Freq: Every day | ORAL | 1 refills | Status: DC

## 2016-09-20 ENCOUNTER — Other Ambulatory Visit: Payer: Self-pay | Admitting: Internal Medicine

## 2016-09-20 DIAGNOSIS — M0579 Rheumatoid arthritis with rheumatoid factor of multiple sites without organ or systems involvement: Secondary | ICD-10-CM

## 2016-09-20 NOTE — Telephone Encounter (Signed)
Last filled 03/2016 90 day supply with 1 refill please advise

## 2016-09-23 ENCOUNTER — Other Ambulatory Visit: Payer: Self-pay

## 2016-09-23 NOTE — Telephone Encounter (Signed)
I do not see where you have ever filled this medication.... please advise 

## 2016-09-25 MED ORDER — XARELTO 20 MG PO TABS: 20.0000 mg | ORAL_TABLET | ORAL | 1 refills | Status: DC

## 2016-09-29 ENCOUNTER — Encounter: Payer: Self-pay | Admitting: Internal Medicine

## 2016-09-29 ENCOUNTER — Ambulatory Visit (INDEPENDENT_AMBULATORY_CARE_PROVIDER_SITE_OTHER): Payer: 59 | Admitting: Internal Medicine

## 2016-09-29 VITALS — BP 110/78 | HR 63 | Temp 98.0°F | Wt 260.0 lb

## 2016-09-29 DIAGNOSIS — M069 Rheumatoid arthritis, unspecified: Secondary | ICD-10-CM

## 2016-09-29 DIAGNOSIS — K219 Gastro-esophageal reflux disease without esophagitis: Secondary | ICD-10-CM | POA: Diagnosis not present

## 2016-09-29 DIAGNOSIS — M79661 Pain in right lower leg: Secondary | ICD-10-CM

## 2016-09-29 MED ORDER — DEXLANSOPRAZOLE 30 MG PO CPDR: 30.0000 mg | DELAYED_RELEASE_CAPSULE | Freq: Every day | ORAL | 2 refills | Status: DC

## 2016-09-29 NOTE — Progress Notes (Signed)
Subjective:    Patient ID: Angel Lee, female    DOB: 12-19-1960, 56 y.o.   MRN: [id number]  HPI  Pt presents to the clinic today with c/o persistent leg pain. She was seen 3/15 and 3/20 for the same. She describes the pain as sore and achy. It is located mainly in the calf. She denies swelling or redness. The pain is worse with weight bearing. She denies numbness or tingling in the calf. She has had a negative venous doppler of the RLE. She is requesting a referral to PT at this time.  She also wants to let me know that her rheumatologist stopper her MTX and Enbrel because her rheumatoid factor was negative.Marland Kitchen She was switched to Prednisone. She reports the achyness in her joints and muscles pains have flared up since this time. She is requesting to be put back on her MTX and Enbrel. She follows up with rheumatology in 2-3 weeks.  She also reports that her reflux is worse. She is not sure what is triggering this. She is taking Zantac 2 x day, but reports it is no longer effective. She has failed Nexium and Prilosec in the past. She is requesting a RX for Dexilant, which she has been on in the past and worked very well for her. She may also need a referral to GI  But wants to hold off on this for now.  Review of Systems      Past Medical History:  Diagnosis Date  . Allergy   . Chicken pox   . DVT (deep venous thrombosis) (HCC) 2011  . GERD (gastroesophageal reflux disease)   . Hyperlipidemia   . Hypertension   . PE (pulmonary embolism) 2011  . Rheumatoid arthritis (HCC)     Current Outpatient Prescriptions  Medication Sig Dispense Refill  . amLODipine (NORVASC) 5 MG tablet Take 1 tablet (5 mg total) by mouth daily. 30 tablet 6  . atenolol (TENORMIN) 50 MG tablet Take 50 mg by mouth daily.  2  . atorvastatin (LIPITOR) 20 MG tablet Take 1 tablet (20 mg total) by mouth daily. 90 tablet 1  . celecoxib (CELEBREX) 200 MG capsule Take 1 capsule (200 mg total) by mouth daily. 90 capsule  1  . etanercept (ENBREL SURECLICK) 50 MG/ML injection Inject 50 mg into the skin once a week.    . folic acid (FOLVITE) 1 MG tablet TAKE 1 TABLET (1 MG TOTAL) BY MOUTH DAILY. 90 tablet 1  . methocarbamol (ROBAXIN) 500 MG tablet TAKE 1 TABLET (500 MG TOTAL) BY MOUTH 2 (TWO) TIMES DAILY. 180 tablet 1  . methotrexate (RHEUMATREX) 2.5 MG tablet Take 3 tablets (7.5 mg total) by mouth once a week. Caution:Chemotherapy. Protect from light. (Patient taking differently: Take 15 mg by mouth once a week. Caution:Chemotherapy. Protect from light.) 36 tablet 1  . ranitidine (ZANTAC) 150 MG tablet Take 150 mg by mouth every morning.    Carlena Hurl 20 MG TABS tablet Take 1 tablet (20 mg total) by mouth See admin instructions. On during travel 30 tablet 1   No current facility-administered medications for this visit.     Allergies  Allergen Reactions  . Azithromycin Nausea Only  . Sulfa Antibiotics Hives  . Typhoid Vaccines Other (See Comments)    Pain, inflammation     Family History  Problem Relation Age of Onset  . Arthritis Mother   . Hyperlipidemia Mother   . Hyperlipidemia Father   . Heart disease Father   . Stroke  Father   . Diabetes Father   . Hypertension Brother   . Prostate cancer Maternal Uncle   . Heart disease Paternal Aunt   . Stroke Paternal Aunt   . Stroke Paternal Uncle   . Hyperlipidemia Maternal Grandmother   . Hyperlipidemia Maternal Grandfather   . Heart disease Maternal Grandfather   . Stroke Maternal Grandfather   . Hypertension Maternal Grandfather   . Hyperlipidemia Paternal Grandmother   . Diabetes Paternal Grandmother   . Hyperlipidemia Paternal Grandfather     Social History   Social History  . Marital status: Single    Spouse name: N/A  . Number of children: N/A  . Years of education: N/A   Occupational History  . Not on file.   Social History Main Topics  . Smoking status: Never Smoker  . Smokeless tobacco: Never Used  . Alcohol use No  . Drug  use: No  . Sexual activity: Not on file   Other Topics Concern  . Not on file   Social History Narrative  . No narrative on file     Constitutional: Denies fever, malaise, fatigue, headache or abrupt weight changes.  Respiratory: Denies difficulty breathing, shortness of breath, cough or sputum production.   Cardiovascular: Denies chest pain, chest tightness, palpitations or swelling in the hands or feet.  Gastrointestinal: Pt reports reflux. Denies abdominal pain, bloating, constipation, diarrhea or blood in the stool.  Musculoskeletal: Pt reports muscle and joint pain. Denies decrease in range of motion, difficulty with gait, or joint swelling.  Neurological: Denies dizziness, difficulty with memory, difficulty with speech or problems with balance and coordination.    No other specific complaints in a complete review of systems (except as listed in HPI above).  Objective:   Physical Exam  BP 110/78   Pulse 63   Temp 98 F (36.7 C) (Oral)   Wt 260 lb (117.9 kg)   SpO2 97%   BMI 46.80 kg/m  Wt Readings from Last 3 Encounters:  09/29/16 260 lb (117.9 kg)  08/15/16 258 lb 12 oz (117.4 kg)  05/17/16 256 lb (116.1 kg)    General: Appears her stated age, obese in NAD. Skin: No redness or warmth of either lower extremity. Abdomen: Soft and nontender. Normal bowel sounds. No distention or masses noted.  Musculoskeletal: Negative Homan's. Pain with palpation of the right calf. Strength 5/5 BLE. No difficulty with gait.  Neurological: Alert and oriented. Sensation intact to BLE.   BMET    Component Value Date/Time   NA 139 08/10/2016 0215   K 4.8 08/10/2016 0215   CL 105 08/10/2016 0215   CO2 26 08/10/2016 0215   GLUCOSE 107 (H) 08/10/2016 0215   BUN 11 08/10/2016 0215   CREATININE 0.99 08/10/2016 0215   CALCIUM 9.1 08/10/2016 0215   GFRNONAA >60 08/10/2016 0215   GFRAA >60 08/10/2016 0215    Lipid Panel     Component Value Date/Time   CHOL 218 (H) 05/17/2016  1414   TRIG 54.0 05/17/2016 1414   HDL 85.20 05/17/2016 1414   CHOLHDL 3 05/17/2016 1414   VLDL 10.8 05/17/2016 1414   LDLCALC 122 (H) 05/17/2016 1414    CBC    Component Value Date/Time   WBC 4.3 08/10/2016 0215   RBC 4.57 08/10/2016 0215   HGB 14.0 08/10/2016 0215   HCT 43.6 08/10/2016 0215   PLT 228 08/10/2016 0215   MCV 95.4 08/10/2016 0215   MCH 30.6 08/10/2016 0215   MCHC 32.1 08/10/2016 0215  RDW 14.7 08/10/2016 0215   LYMPHSABS 2.1 08/10/2016 0215   MONOABS 0.4 08/10/2016 0215   EOSABS 0.1 08/10/2016 0215   BASOSABS 0.0 08/10/2016 0215    Hgb A1C Lab Results  Component Value Date   HGBA1C 5.9 05/17/2016            Assessment & Plan:   Right Leg Pain:  No concern for DVT at this time Referral to PT per pt request  RA:  Deteriorated since stopping MTX and Enbrel Advised her to call rheumatology to discuss  GERD:  eRx for Dexilant If worse, will refer to GI  RTC in 7 months for your annual exam Nicki Reaper, NP

## 2016-10-02 NOTE — Patient Instructions (Signed)
Food Choices for Gastroesophageal Reflux Disease, Adult When you have gastroesophageal reflux disease (GERD), the foods you eat and your eating habits are very important. Choosing the right foods can help ease your discomfort. What guidelines do I need to follow?  Choose fruits, vegetables, whole grains, and low-fat dairy products.  Choose low-fat meat, fish, and poultry.  Limit fats such as oils, salad dressings, butter, nuts, and avocado.  Keep a food diary. This helps you identify foods that cause symptoms.  Avoid foods that cause symptoms. These may be different for everyone.  Eat small meals often instead of 3 large meals a day.  Eat your meals slowly, in a place where you are relaxed.  Limit fried foods.  Cook foods using methods other than frying.  Avoid drinking alcohol.  Avoid drinking large amounts of liquids with your meals.  Avoid bending over or lying down until 2-3 hours after eating. What foods are not recommended? These are some foods and drinks that may make your symptoms worse: Vegetables  Tomatoes. Tomato juice. Tomato and spaghetti sauce. Chili peppers. Onion and garlic. Horseradish. Fruits  Oranges, grapefruit, and lemon (fruit and juice). Meats  High-fat meats, fish, and poultry. This includes hot dogs, ribs, ham, sausage, salami, and bacon. Dairy  Whole milk and chocolate milk. Sour cream. Cream. Butter. Ice cream. Cream cheese. Drinks  Coffee and tea. Bubbly (carbonated) drinks or energy drinks. Condiments  Hot sauce. Barbecue sauce. Sweets/Desserts  Chocolate and cocoa. Donuts. Peppermint and spearmint. Fats and Oils  High-fat foods. This includes French fries and potato chips. Other  Vinegar. Strong spices. This includes black pepper, white pepper, red pepper, cayenne, curry powder, cloves, ginger, and chili powder. The items listed above may not be a complete list of foods and drinks to avoid. Contact your dietitian for more information.    This information is not intended to replace advice given to you by your health care provider. Make sure you discuss any questions you have with your health care provider. Document Released: 11/14/2011 Document Revised: 10/21/2015 Document Reviewed: 03/19/2013 Elsevier Interactive Patient Education  2017 Elsevier Inc.  

## 2016-10-31 ENCOUNTER — Ambulatory Visit: Payer: 59 | Admitting: Physical Therapy

## 2016-11-07 ENCOUNTER — Ambulatory Visit: Payer: 59 | Attending: Internal Medicine

## 2016-11-07 DIAGNOSIS — M25661 Stiffness of right knee, not elsewhere classified: Secondary | ICD-10-CM

## 2016-11-07 DIAGNOSIS — M79661 Pain in right lower leg: Secondary | ICD-10-CM

## 2016-11-07 DIAGNOSIS — M25662 Stiffness of left knee, not elsewhere classified: Secondary | ICD-10-CM | POA: Diagnosis not present

## 2016-11-07 NOTE — Therapy (Signed)
[facility] [address] [place], [place], [address] Phone: [phone]   Fax:  [phone]  Physical Therapy Evaluation  Patient Details  Name: Angel Lee MRN: [id number] Date of Birth: 05-01-1961 Referring Provider: Nicki Reaper, NP  Encounter Date: 11/07/2016      PT End of Session - 11/07/16 1624    Visit Number 1   Number of Visits 8   Date for PT Re-Evaluation 12/01/16   Authorization Type [employer]   PT Start Time 0430   PT Stop Time 0515   PT Time Calculation (min) 45 min   Activity Tolerance Patient tolerated treatment well   Behavior During Therapy [facility] for tasks assessed/performed      Past Medical History:  Diagnosis Date  . Allergy   . Chicken pox   . DVT (deep venous thrombosis) (HCC) 2011  . GERD (gastroesophageal reflux disease)   . Hyperlipidemia   . Hypertension   . PE (pulmonary embolism) 2011  . Rheumatoid arthritis [facility])     Past Surgical History:  Procedure Laterality Date  . FOOT SURGERY Right 2003  . KNEE ARTHROSCOPY Bilateral 1981, 1998, 2007  . ROTATOR CUFF REPAIR Bilateral 2000, 2005  . wisdom teeth      There were no vitals filed for this visit.       Subjective Assessment - 11/07/16 1630    Subjective She reports RT calf pain She eport RA and has had surgery on multiple joints. Pain posterior knee.  RT.   DTV in past but negative.  Started a couple of months ago.  She has had scopes x 2 in RT knee and as teen patella release.        Pertinent History bilateral knee scopes    Limitations --  sleep     How long can you sit comfortably? As needed   How long can you stand comfortably? 20 min   How long can you walk comfortably? 7000 to 8000 steps per day , staris uncomfortable .    Diagnostic tests none   Patient Stated Goals Exercise for strength.    Currently in Pain? Yes   Pain Score 4    Pain Location Knee   Pain Orientation Right   Pain Descriptors / Indicators  Dull;Throbbing   Pain Type Chronic pain   Pain Onset More than a month ago   Pain Frequency Intermittent   Aggravating Factors  Stairs ( chronic problem)   Pain Relieving Factors biofreeze, stretch , ice   Multiple Pain Sites No            [facility] PT Assessment - 11/07/16 0001      Assessment   Medical Diagnosis RT knee pain    Referring Provider Nicki Reaper, NP   Onset Date/Surgical Date --  2 months ago   Next MD Visit As needed   Prior Therapy NO     Precautions   Precautions None     Restrictions   Weight Bearing Restrictions No     Balance Screen   Has the patient fallen in the past 6 months Yes   How many times? 1  slide off rolling chair   Has the patient had a decrease in activity level because of a fear of falling?  No   Is the patient reluctant to leave their home because of a fear of falling?  No     Prior Function   Level of Independence Independent     Cognition  Overall Cognitive Status Within Functional Limits for tasks assessed     Observation/Other Assessments   Focus on Therapeutic Outcomes (FOTO)  41% limited     ROM / Strength   AROM / PROM / Strength Strength;AROM     AROM   AROM Assessment Site Knee;Ankle   Right/Left Knee Right;Left   Right Knee Extension -15   Right Knee Flexion 90  prone 85 deg   Left Knee Extension -10   Left Knee Flexion 110  prone 105 deg   Right/Left Ankle Right;Left   Right Ankle Dorsiflexion 106   Right Ankle Plantar Flexion --  normal   Right Ankle Inversion --  normal    Right Ankle Eversion --  normal   Left Ankle Dorsiflexion 110   Left Ankle Plantar Flexion --  normal   Left Ankle Inversion --  normal   Left Ankle Eversion --  normal     Strength   Overall Strength Comments Bilateral hip strength 4+/5 hips    Strength Assessment Site Knee;Ankle   Right/Left Knee Right;Left   Right Knee Flexion 5/5   Right Knee Extension 5/5   Left Knee Flexion 5/5   Left Knee Extension 5/5    Right/Left Ankle Right;Left   Right Ankle Dorsiflexion 5/5   Right Ankle Plantar Flexion 5/5   Right Ankle Inversion 5/5   Right Ankle Eversion 5/5   Left Ankle Dorsiflexion 5/5   Left Ankle Plantar Flexion 5/5   Left Ankle Inversion 5/5   Left Ankle Eversion 5/5     Flexibility   Soft Tissue Assessment /Muscle Length yes   Hamstrings 70 degrees bilaterally     Palpation   Palpation comment tender lateral distal hamstring insertion RT knee     Ambulation/Gait   Gait Comments WNL  Decr smoothness to pattern due to knee tightness  with decreased  extension.             Objective measurements completed on examination: See above findings.                       PT Long Term Goals - 11/07/16 1719      PT LONG TERM GOAL #1   Title She will be independent with all HEP issued   Time 4   Period Weeks   Status New     PT LONG TERM GOAL #2   Title She will report pain decreased 50% or more in intensity and frequency.    Time 4   Period Weeks   Status New     PT LONG TERM GOAL #3   Title she will report improved sleep due to decr pain.    Time 4   Period Weeks   Status New     PT LONG TERM GOAL #4   Title She will improve RT knee extension to -10 degrees   Time 4   Period Weeks   Status New     PT LONG TERM GOAL #5   Title She will improve RT knee flexion to 95 degrees   Time 4   Period Weeks   Status New                Plan - 11/07/16 1625    Clinical Impression Statement Angel Lee presents for RT calf pain without injury.  She has RA and history of scope x 2 to RT knee and scope to LT knee. She is very active and is not  lmited due to pain though descent of stairs is uncomfortable and she has some sleep disturbance.    She probably should consult orth MD for assessment of degenerative changes, possible cause of pain.    History and Personal Factors relevant to plan of care: RA, previous scopes to knees.    Clinical Presentation Stable    Clinical Decision Making Low   Rehab Potential Good   PT Frequency 2x / week   PT Duration 4 weeks   PT Treatment/Interventions Ultrasound;Dry needling;Iontophoresis 4mg /ml Dexamethasone;Manual techniques;Taping;Patient/family education;Therapeutic exercise   PT Next Visit Plan Stretching and add to HEP if needed, manual , modalities   PT Home Exercise Plan hamstring and dald stretching and knee flexion stretching   Consulted and Agree with Plan of Care Patient      Patient will benefit from skilled therapeutic intervention in order to improve the following deficits and impairments:  Pain, Decreased range of motion, Increased muscle spasms  Visit Diagnosis: Right calf pain  Stiffness of left knee, not elsewhere classified  Stiffness of right knee, not elsewhere classified     Problem List Patient Active Problem List   Diagnosis Date Noted  . Rheumatoid arthritis involving multiple sites with positive rheumatoid factor (HCC) 10/07/2015  . GERD (gastroesophageal reflux disease) 10/07/2015  . Seasonal allergies 10/07/2015  . HTN (hypertension) 10/07/2015  . HLD (hyperlipidemia) 10/07/2015  . History of DVT (deep vein thrombosis) 10/07/2015  . History of pulmonary embolism 10/07/2015  . Severe obesity (BMI >= 40) (HCC) 10/07/2015    12/07/2015  PT 11/07/2016, 5:26 PM  [facility] [address] [place], [place], [place] Phone: [phone]   Fax:  [phone]  Name: Angel Lee MRN: Glennon Hamilton Date of Birth: 10-04-1960

## 2016-11-09 ENCOUNTER — Encounter: Payer: Self-pay | Admitting: Physical Therapy

## 2016-11-09 ENCOUNTER — Ambulatory Visit: Payer: 59 | Admitting: Physical Therapy

## 2016-11-09 DIAGNOSIS — M79661 Pain in right lower leg: Secondary | ICD-10-CM

## 2016-11-09 DIAGNOSIS — M25661 Stiffness of right knee, not elsewhere classified: Secondary | ICD-10-CM

## 2016-11-09 DIAGNOSIS — M25662 Stiffness of left knee, not elsewhere classified: Secondary | ICD-10-CM

## 2016-11-09 NOTE — Therapy (Signed)
[facility] [address] [place], [place], [address] Phone: [phone]   Fax:  [phone]  Physical Therapy Treatment  Patient Details  Name: Angel Lee MRN: [id number] Date of Birth: July 26, 1960 Referring Provider: Nicki Reaper, NP  Encounter Date: 11/09/2016      PT End of Session - 11/09/16 1653    Visit Number 2   Number of Visits 8   Date for PT Re-Evaluation 12/01/16   PT Start Time 1554  Short session patient late   PT Stop Time 1630   PT Time Calculation (min) 36 min   Activity Tolerance Patient tolerated treatment well   Behavior During Therapy [facility] for tasks assessed/performed      Past Medical History:  Diagnosis Date  . Allergy   . Chicken pox   . DVT (deep venous thrombosis) (HCC) 2011  . GERD (gastroesophageal reflux disease)   . Hyperlipidemia   . Hypertension   . PE (pulmonary embolism) 2011  . Rheumatoid arthritis [facility])     Past Surgical History:  Procedure Laterality Date  . FOOT SURGERY Right 2003  . KNEE ARTHROSCOPY Bilateral 1981, 1998, 2007  . ROTATOR CUFF REPAIR Bilateral 2000, 2005  . wisdom teeth      There were no vitals filed for this visit.      Subjective Assessment - 11/09/16 1638    Subjective She has been doing her HEP.  Calf is a little less sore.  Wprks her exercises into her day.   Currently in Pain? Yes   Pain Score --  mild   Pain Location Knee   Pain Orientation Right   Pain Descriptors / Indicators Tightness;Dull   Pain Type Chronic pain   Pain Frequency Intermittent   Aggravating Factors  staire   Pain Relieving Factors ice, stretch, biofreeze   Multiple Pain Sites --  has right shoulder pain , MD is aware and following                         [facility] Adult PT Treatment/Exercise - 11/09/16 0001      Knee/Hip Exercises: Stretches   Passive Hamstring Stretch 3 reps;30 seconds     Knee/Hip Exercises: Supine   Quad Sets 10 reps   Quad Sets  Limitations pain calf mild discomfort, better with small roll   Heel Slides 1 set;10 reps   Heel Slides Limitations lacks ROM , cues to go full range and hold   Bridges Limitations Hip Tilt:, modified bridge"  10 x Cues initiaslly, challanging   Straight Leg Raises 1 set;10 reps;Both   Straight Leg Raises Limitations cued   Patellar Mobs yes tight initially right  only   Other Supine Knee/Hip Exercises Hip IR/ER 10 X, full ROM and 5 second holding encouraged.  Hip abd/ ADD single legs each 10 x cued for holding.       Manual Therapy   Manual Therapy Soft tissue mobilization;Passive ROM   Soft tissue mobilization posterior knee   Passive ROM knee extension,  tibia mobs for extension. posterior knee STW                 PT Education - 11/09/16 1652    Education provided Yes   Education Details HEP,  Arthritis exercise parameters   Person(s) Educated Patient   Methods Explanation;Demonstration;Tactile cues;Verbal cues;Handout   Comprehension Verbalized understanding;Returned demonstration;Need further instruction             PT Long Term  Goals - 11/07/16 1719      PT LONG TERM GOAL #1   Title She will be independent with all HEP issued   Time 4   Period Weeks   Status New     PT LONG TERM GOAL #2   Title She will report pain decreased 50% or more in intensity and frequency.    Time 4   Period Weeks   Status New     PT LONG TERM GOAL #3   Title she will report improved sleep due to decr pain.    Time 4   Period Weeks   Status New     PT LONG TERM GOAL #4   Title She will improve RT knee extension to -10 degrees   Time 4   Period Weeks   Status New     PT LONG TERM GOAL #5   Title She will improve RT knee flexion to 95 degrees   Time 4   Period Weeks   Status New               Plan - 11/09/16 1654    Clinical Impression Statement Progressing with HEP for RA.  Patient receptive to info.  NO new objective findings.  Patient compliant with HEP.   Right knee stiff with moving fromextension into flexion.    PT Next Visit Plan continue with Exercises,  She will bring handout to each session   PT Home Exercise Plan hamstring and calf stretching and knee flexion stretching,  Hip IR/ER/ADD/ ABDuction, SLR, Hip tilt   Consulted and Agree with Plan of Care Patient      Patient will benefit from skilled therapeutic intervention in order to improve the following deficits and impairments:  Pain, Decreased range of motion, Increased muscle spasms  Visit Diagnosis: Right calf pain  Stiffness of left knee, not elsewhere classified  Stiffness of right knee, not elsewhere classified     Problem List Patient Active Problem List   Diagnosis Date Noted  . Rheumatoid arthritis involving multiple sites with positive rheumatoid factor (HCC) 10/07/2015  . GERD (gastroesophageal reflux disease) 10/07/2015  . Seasonal allergies 10/07/2015  . HTN (hypertension) 10/07/2015  . HLD (hyperlipidemia) 10/07/2015  . History of DVT (deep vein thrombosis) 10/07/2015  . History of pulmonary embolism 10/07/2015  . Severe obesity (BMI >= 40) (HCC) 10/07/2015    Texie Tupou PTA 11/09/2016, 4:58 PM  [facility] [address] [place], [place], [address] Phone: [phone]   Fax:  [phone]  Name: Angel Lee MRN: [id number] Date of Birth: 27-Jun-1960

## 2016-11-09 NOTE — Patient Instructions (Signed)
Arthritis handbook 58 pages, Hip ROM 5-10 x each Daily IR/ER, heel slides, SLR, ABD/ADD,  Hip tilt,

## 2016-11-14 ENCOUNTER — Ambulatory Visit: Payer: 59 | Admitting: Physical Therapy

## 2016-11-14 DIAGNOSIS — M150 Primary generalized (osteo)arthritis: Secondary | ICD-10-CM | POA: Diagnosis not present

## 2016-11-14 DIAGNOSIS — M25662 Stiffness of left knee, not elsewhere classified: Secondary | ICD-10-CM

## 2016-11-14 DIAGNOSIS — M0609 Rheumatoid arthritis without rheumatoid factor, multiple sites: Secondary | ICD-10-CM | POA: Diagnosis not present

## 2016-11-14 DIAGNOSIS — M79661 Pain in right lower leg: Secondary | ICD-10-CM | POA: Diagnosis not present

## 2016-11-14 DIAGNOSIS — M25661 Stiffness of right knee, not elsewhere classified: Secondary | ICD-10-CM

## 2016-11-14 DIAGNOSIS — M2550 Pain in unspecified joint: Secondary | ICD-10-CM | POA: Diagnosis not present

## 2016-11-14 NOTE — Therapy (Signed)
[facility] [address] [place], [place], [address] Phone: [phone]   Fax:  [phone]  Physical Therapy Treatment  Patient Details  Name: Angel Lee MRN: [id number] Date of Birth: 1960-07-19 Referring Provider: Nicki Reaper, NP  Encounter Date: 11/14/2016      PT End of Session - 11/14/16 0829    Visit Number 3   Number of Visits 8   Date for PT Re-Evaluation 12/01/16   PT Start Time 0738   PT Stop Time 0803   PT Time Calculation (min) 25 min   Activity Tolerance Patient tolerated treatment well   Behavior During Therapy [facility] for tasks assessed/performed      Past Medical History:  Diagnosis Date  . Allergy   . Chicken pox   . DVT (deep venous thrombosis) (HCC) 2011  . GERD (gastroesophageal reflux disease)   . Hyperlipidemia   . Hypertension   . PE (pulmonary embolism) 2011  . Rheumatoid arthritis [facility])     Past Surgical History:  Procedure Laterality Date  . FOOT SURGERY Right 2003  . KNEE ARTHROSCOPY Bilateral 1981, 1998, 2007  . ROTATOR CUFF REPAIR Bilateral 2000, 2005  . wisdom teeth      There were no vitals filed for this visit.      Subjective Assessment - 11/14/16 0835    Subjective I am getting up and easing into the day.  I take a shower and warm up prior to stretching.  It is going well. I was sore after the last session, but I was OK.   Currently in Pain? Yes   Pain Score --  No number give this morning when asked,  mild   Pain Location Knee   Pain Orientation Right;Posterior   Pain Descriptors / Indicators Sore   Pain Type Chronic pain   Pain Frequency Intermittent   Aggravating Factors  end range stretch   Pain Relieving Factors ice stretch biofreeze   Multiple Pain Sites --  right shoulder pain mild to moderate with abduction,                           [facility] Adult PT Treatment/Exercise - 11/14/16 0001      Self-Care   Self-Care Posture   Posture lumbar  roll,  small platform under feet,  tilt to neutral with sitting vs arching at lumbo/ thoracic junction      Lumbar Exercises: Stretches   Single Knee to Chest Stretch 3 reps   Single Knee to Chest Stretch Limitations each PROM 20 seconds   Pelvic Tilt --  to work on posture   Piriformis Stretch 2 reps;20 seconds     Knee/Hip Exercises: Supine   Quad Sets 5 reps   Quad Sets Limitations mild stretch     Shoulder Exercises: Seated   Retraction 5 reps  cues,  HEP   Protraction Limitations cues needed to keep shoulders depressed.  5 X emphasis posterior,  HEP   ABduction Limitations painful approcahing 90,  did not add to HEP     Shoulder Exercises: Stretch   Corner Stretch Limitations doorway stretch , single arm 3 X 20 each, cues initially,  ROM Full   Internal Rotation Stretch 3 reps   Internal Rotation Stretch Limitations HEP, cues reaching up back,  reaches just bellow bra line HEP   External Rotation Stretch 3 reps  gentle hands on shoulders moving elbows in /out  HEP   Elbow Flexion 5  reps   Wall Stretch - Flexion 2 reps;30 seconds  HEP.  Doing this vs AROM                PT Education - 11/14/16 0828    Education provided Yes   Education Details HEP,  Posture   Person(s) Educated Patient   Methods Explanation;Demonstration;Tactile cues;Verbal cues  has handout already   Comprehension Verbalized understanding;Returned demonstration             PT Long Term Goals - 11/07/16 1719      PT LONG TERM GOAL #1   Title She will be independent with all HEP issued   Time 4   Period Weeks   Status New     PT LONG TERM GOAL #2   Title She will report pain decreased 50% or more in intensity and frequency.    Time 4   Period Weeks   Status New     PT LONG TERM GOAL #3   Title she will report improved sleep due to decr pain.    Time 4   Period Weeks   Status New     PT LONG TERM GOAL #4   Title She will improve RT knee extension to -10 degrees   Time 4    Period Weeks   Status New     PT LONG TERM GOAL #5   Title She will improve RT knee flexion to 95 degrees   Time 4   Period Weeks   Status New               Plan - 11/14/16 4854    Clinical Impression Statement Patient has embraced and is compliant suggestions for exercising with RA.  She is doing the exercises correctly and shows increased Knee extension right. She progressed to the UE section of the handout.  Care taken to avoid pain with any impingement type position exercises that cause pain (longstanding issues RT>LT).    PT Treatment/Interventions Ultrasound;Dry needling;Iontophoresis 4mg /ml Dexamethasone;Manual techniques;Taping;Patient/family education;Therapeutic exercise   PT Next Visit Plan continue with Exercises,  She will bring handout to each session.  Next Neck/hand section.  Review previous as needed. Consider table slides for abduction and Self mobs to increase IR.    PT Home Exercise Plan hamstring and calf stretching and knee flexion stretching,  Hip IR/ER/ADD/ ABDuction, SLR, Hip tilt.  UE ROM   Consulted and Agree with Plan of Care Patient      Patient will benefit from skilled therapeutic intervention in order to improve the following deficits and impairments:  Pain, Decreased range of motion, Increased muscle spasms  Visit Diagnosis: Right calf pain  Stiffness of left knee, not elsewhere classified  Stiffness of right knee, not elsewhere classified     Problem List Patient Active Problem List   Diagnosis Date Noted  . Rheumatoid arthritis involving multiple sites with positive rheumatoid factor (HCC) 10/07/2015  . GERD (gastroesophageal reflux disease) 10/07/2015  . Seasonal allergies 10/07/2015  . HTN (hypertension) 10/07/2015  . HLD (hyperlipidemia) 10/07/2015  . History of DVT (deep vein thrombosis) 10/07/2015  . History of pulmonary embolism 10/07/2015  . Severe obesity (BMI >= 40) (HCC) 10/07/2015    Eliane Hammersmith PTA 11/14/2016, 8:38  AM  [facility] [address] [place], [place], [place] Phone: [phone]   Fax:  [phone]  Name: Angel Lee MRN: Glennon Hamilton Date of Birth: 06-Jul-1960

## 2016-11-14 NOTE — Patient Instructions (Signed)
Continued ed from Arthritis hand book. Added shoulder section all except abduction  Daily X 5-10 X hold 10-30 seconds Avoid pain . Exercises modified on her copy to customize

## 2016-11-16 ENCOUNTER — Ambulatory Visit: Payer: 59 | Admitting: Physical Therapy

## 2016-11-16 DIAGNOSIS — M79661 Pain in right lower leg: Secondary | ICD-10-CM

## 2016-11-16 DIAGNOSIS — M25661 Stiffness of right knee, not elsewhere classified: Secondary | ICD-10-CM

## 2016-11-16 DIAGNOSIS — M25662 Stiffness of left knee, not elsewhere classified: Secondary | ICD-10-CM

## 2016-11-16 NOTE — Therapy (Signed)
[place], [place], [address] Phone: [phone]   Fax:  [phone]  Physical Therapy Treatment  Patient Details  Name: Angel Lee MRN: [id number] Date of Birth: [birth year] Referring Provider: Webb Silversmith, NP  Encounter Date: 11/16/2016      PT End of Session - 11/16/16 0727    Visit Number 4   Number of Visits 8   Date for PT Re-Evaluation 12/01/16   Authorization Type UHC   PT Start Time 0719   PT Stop Time 0758   PT Time Calculation (min) 39 min      Past Medical History:  Diagnosis Date  . Allergy   . Chicken pox   . DVT (deep venous thrombosis) ([place]) 2011  . GERD (gastroesophageal reflux disease)   . Hyperlipidemia   . Hypertension   . PE (pulmonary embolism) 2011  . Rheumatoid arthritis [facility])     Past Surgical History:  Procedure Laterality Date  . FOOT SURGERY Right 2003  . KNEE ARTHROSCOPY Bilateral 1981, 1998, 2007  . ROTATOR CUFF REPAIR Bilateral 2000, 2005  . wisdom teeth      There were no vitals filed for this visit.      Subjective Assessment - 11/16/16 0724    Subjective Took a warm shower, feeling okay. I have normal morning stiffness in my joints.    Currently in Pain? Yes   Pain Score 4    Pain Location Shoulder   Pain Orientation Right   Multiple Pain Sites --  low back, both knees 4/10            [facility] PT Assessment - 11/16/16 0001      AROM   Right Knee Extension -13   Right Knee Flexion 90                     [facility] Adult PT Treatment/Exercise - 11/16/16 0001      Lumbar Exercises: Stretches   Single Knee to Chest Stretch 2 reps;20 seconds   Pelvic Tilt 5 reps  to work on posture   Pelvic Tilt Limitations sitting      Lumbar Exercises: Aerobic   Stationary Bike Rec Bike full revolutions to L1 x 5 minutes      Knee/Hip Exercises: Stretches   Hip Flexor Stretch 3 reps;30 seconds   Hip Flexor Stretch Limitations with massage  roller and overpressure stretching into knee flexion   Knee: Self-Stretch Limitations seated scoot with foot planted      Shoulder Exercises: Seated   Retraction 5 reps  cues,  HEP     Hand Exercises   Other Hand Exercises Wrist and Hand exercises from arthritis handout: Wrist AROM, finger opposition and extension, fist to spread fingers, fingers moving toward thumb one at at time, thumb circles, thumb abduction                     PT Long Term Goals - 11/16/16 0736      PT LONG TERM GOAL #1   Title She will be independent with all HEP issued   Time 4   Period Weeks   Status On-going     PT LONG TERM GOAL #2   Title She will  report 50% decrease in frequency and intensity of pain    Baseline 20-25 % decrease   Time 4   Period Weeks   Status On-going     PT LONG TERM GOAL #3  Title she will report improved sleep due to decr pain.    Baseline 60% improved    Time 4   Period Weeks   Status Achieved     PT LONG TERM GOAL #4   Title She will improve RT knee extension to -10 degrees   Time 4   Period Weeks   Status On-going     PT LONG TERM GOAL #5   Title She will improve RT knee flexion to 95 degrees   Baseline 90   Time 4   Period Weeks   Status On-going               Plan - 11/16/16 0755    Clinical Impression Statement Pt reports 20-25 % improvement in daily pain and 60 % improvement in pain with trying to  LTG# 3 met. We began wrist/hand exercises from arthritis handout. She notes increased restrictions in right hand and wrist. Her right knee AROM is unchanged. Reviewed stretches and began recumbent bike. USed massage roller along right quad and hip flexor. Kness flexion AROM improved from 90 to 93 at end of treatment.    PT Next Visit Plan continue with Exercises,  She will bring handout to each session.  Next Neck/hand section.  Review previous as needed. Consider table slides for abduction and Self mobs to increase IR.    PT Home Exercise  Plan hamstring and calf stretching and knee flexion stretching,  Hip IR/ER/ADD/ ABDuction, SLR, Hip tilt.  UE ROM   Consulted and Agree with Plan of Care Patient      Patient will benefit from skilled therapeutic intervention in order to improve the following deficits and impairments:  Pain, Decreased range of motion, Increased muscle spasms  Visit Diagnosis: Right calf pain  Stiffness of left knee, not elsewhere classified  Stiffness of right knee, not elsewhere classified     Problem List Patient Active Problem List   Diagnosis Date Noted  . Rheumatoid arthritis involving multiple sites with positive rheumatoid factor ([place]) 10/07/2015  . GERD (gastroesophageal reflux disease) 10/07/2015  . Seasonal allergies 10/07/2015  . HTN (hypertension) 10/07/2015  . HLD (hyperlipidemia) 10/07/2015  . History of DVT (deep vein thrombosis) 10/07/2015  . History of pulmonary embolism 10/07/2015  . Severe obesity (BMI >= 40) (HCC) 10/07/2015    Dorene Ar, PTA 11/16/2016, 9:12 AM  [place], [place], [address] Phone: [phone]   Fax:  [phone]  Name: Brittnie Lewey MRN: [id number] Date of Birth: [birth year]

## 2016-11-21 ENCOUNTER — Ambulatory Visit: Payer: 59 | Admitting: Physical Therapy

## 2016-11-23 ENCOUNTER — Ambulatory Visit: Payer: 59

## 2016-11-23 DIAGNOSIS — M25662 Stiffness of left knee, not elsewhere classified: Secondary | ICD-10-CM

## 2016-11-23 DIAGNOSIS — M79661 Pain in right lower leg: Secondary | ICD-10-CM | POA: Diagnosis not present

## 2016-11-23 DIAGNOSIS — M25661 Stiffness of right knee, not elsewhere classified: Secondary | ICD-10-CM

## 2016-11-23 NOTE — Therapy (Addendum)
[facility] [address] [place], [place], [address] Phone: [phone]   Fax:  [phone]  Physical Therapy Treatment  Patient Details  Name: Angel Lee MRN: [id number] Date of Birth: 03/04/1961 Referring Provider: Nicki Reaper, NP  Encounter Date: 11/23/2016      PT End of Session - 11/23/16 1548    Visit Number 5   Number of Visits 8   Date for PT Re-Evaluation 12/01/16   Authorization Type UHC   PT Start Time 0345   PT Stop Time 0430   PT Time Calculation (min) 45 min   Activity Tolerance Patient tolerated treatment well   Behavior During Therapy [facility] for tasks assessed/performed      Past Medical History:  Diagnosis Date  . Allergy   . Chicken pox   . DVT (deep venous thrombosis) (HCC) 2011  . GERD (gastroesophageal reflux disease)   . Hyperlipidemia   . Hypertension   . PE (pulmonary embolism) 2011  . Rheumatoid arthritis [facility])     Past Surgical History:  Procedure Laterality Date  . FOOT SURGERY Right 2003  . KNEE ARTHROSCOPY Bilateral 1981, 1998, 2007  . ROTATOR CUFF REPAIR Bilateral 2000, 2005  . wisdom teeth      There were no vitals filed for this visit.      Subjective Assessment - 11/23/16 1548    Subjective Calf bothering the other night . Sore from straightening.    Depends on activity.   Sitting give s less discomfort.    Currently in Pain? Yes   Pain Score 3    Pain Location Calf   Pain Orientation Right   Pain Descriptors / Indicators Sore   Pain Type Chronic pain   Pain Onset More than a month ago   Aggravating Factors  Streching , after sitting   Pain Relieving Factors ice   , biofreeze                         [facility] Adult PT Treatment/Exercise - 11/23/16 0001      Lumbar Exercises: Supine   Other Supine Lumbar Exercises TKE into ball 10 sec x 15 reps     Knee/Hip Exercises: Stretches   Lobbyist Limitations with hip flexor stretch gravity then manual x  30-45 sec x 5    Gastroc Stretch Right;2 reps;30 seconds     Knee/Hip Exercises: Supine   Short Arc Quad Sets Right   Short Arc Quad Sets Limitations 15 reps with press knee into ball 10sec x 15.    Other Supine Knee/Hip Exercises Knee flexio x 10 reps with 15-20 sec stretch for flex ROM.  with gravity     Knee/Hip Exercises: Prone   Other Prone Exercises TKE x 15 5 sec hold.      Manual Therapy   Passive ROM hip flexor and quads on RT  45 sec hold x 5                PT Education - 11/23/16 1630    Education provided Yes   Education Details hip flexor and quad stretch   Person(s) Educated Patient   Methods Explanation;Demonstration;Verbal cues;Handout   Comprehension Returned demonstration;Verbalized understanding             PT Long Term Goals - 11/23/16 1631      PT LONG TERM GOAL #1   Title She will be independent with all HEP issued   Status On-going  PT LONG TERM GOAL #2   Title She will  report 50% decrease in frequency and intensity of pain    Baseline 20-25 % decrease   Status On-going     PT LONG TERM GOAL #3   Title she will report improved sleep due to decr pain.    Baseline 60% improved    Status Achieved     PT LONG TERM GOAL #4   Title She will improve RT knee extension to -10 degrees   Status On-going     PT LONG TERM GOAL #5   Title She will improve RT knee flexion to 95 degrees   Baseline 90 passive 100 degrees   Status On-going             Patient will benefit from skilled therapeutic intervention in order to improve the following deficits and impairments:     Visit Diagnosis: Right calf pain  Stiffness of left knee, not elsewhere classified  Stiffness of right knee, not elsewhere classified     Problem List Patient Active Problem List   Diagnosis Date Noted  . Rheumatoid arthritis involving multiple sites with positive rheumatoid factor (HCC) 10/07/2015  . GERD (gastroesophageal reflux disease) 10/07/2015   . Seasonal allergies 10/07/2015  . HTN (hypertension) 10/07/2015  . HLD (hyperlipidemia) 10/07/2015  . History of DVT (deep vein thrombosis) 10/07/2015  . History of pulmonary embolism 10/07/2015  . Severe obesity (BMI >= 40) (HCC) 10/07/2015   PLAN:  Cliniical impression :  Ms Pigeon has not been here consistently with work conflicts and illness She has made some improvements so an extension is warented for 5 more visits past this visit  .                                                        2x/week for up to 3 more week to utilize these visits.                                                                                                 She should benefit with decreased pain in improved function/ incr ROM                                                             Treatment will include modalities , manual treatments and exercises with HEP                                                  PT agreed to plan  Caprice Red  PT 11/23/2016, 4:33 PM  [facility] [address] [place], [place], [address] Phone: [phone]   Fax:  [phone]  Name: Kymberley Verbeek MRN: [id number] Date of Birth: Mar 07, 1961

## 2016-11-23 NOTE — Patient Instructions (Signed)
Hip flexor and quad stretch in sitting 20-30 sec with leg of side and foot behind with trunk extension 23-x/day 2-3 reps

## 2016-11-25 ENCOUNTER — Other Ambulatory Visit: Payer: Self-pay | Admitting: Internal Medicine

## 2016-11-30 ENCOUNTER — Ambulatory Visit: Payer: 59

## 2016-12-05 ENCOUNTER — Ambulatory Visit: Payer: 59

## 2016-12-06 NOTE — Addendum Note (Signed)
Addended by: Caprice Red on: 12/06/2016 06:55 AM   Modules accepted: Orders

## 2016-12-07 ENCOUNTER — Encounter: Payer: 59 | Admitting: Physical Therapy

## 2016-12-19 ENCOUNTER — Other Ambulatory Visit: Payer: Self-pay | Admitting: Internal Medicine

## 2016-12-19 ENCOUNTER — Encounter: Payer: Self-pay | Admitting: Physical Therapy

## 2016-12-19 ENCOUNTER — Ambulatory Visit: Payer: 59 | Attending: Internal Medicine | Admitting: Physical Therapy

## 2016-12-19 DIAGNOSIS — M25661 Stiffness of right knee, not elsewhere classified: Secondary | ICD-10-CM | POA: Insufficient documentation

## 2016-12-19 DIAGNOSIS — M79661 Pain in right lower leg: Secondary | ICD-10-CM | POA: Insufficient documentation

## 2016-12-19 DIAGNOSIS — M25662 Stiffness of left knee, not elsewhere classified: Secondary | ICD-10-CM | POA: Insufficient documentation

## 2016-12-19 NOTE — Therapy (Addendum)
[facility] [address] [place], [place], [address] Phone: [phone]   Fax:  [phone]  Physical Therapy Treatment/ discharge  Patient Details  Name: Angel Lee MRN: [id number] Date of Birth: 04/02/1961 Referring Provider: Regina Baity, NP  Encounter Date: 12/19/2016      PT End of Session - 12/19/16 0851    Visit Number 6   Number of Visits 8   Date for PT Re-Evaluation 12/01/16   PT Start Time 0736   PT Stop Time 0805   PT Time Calculation (min) 29 min   Activity Tolerance Patient limited by pain   Behavior During Therapy [facility] for tasks assessed/performed      Past Medical History:  Diagnosis Date  . Allergy   . Chicken pox   . DVT (deep venous thrombosis) (HCC) 2011  . GERD (gastroesophageal reflux disease)   . Hyperlipidemia   . Hypertension   . PE (pulmonary embolism) 2011  . Rheumatoid arthritis (HCC)     Past Surgical History:  Procedure Laterality Date  . FOOT SURGERY Right 2003  . KNEE ARTHROSCOPY Bilateral 1981, 1998, 2007  . ROTATOR CUFF REPAIR Bilateral 2000, 2005  . wisdom teeth      There were no vitals filed for this visit.      Subjective Assessment - 12/19/16 0743    Subjective I want to work on right hip today.    Currently in Pain? No/denies   Pain Location Calf   Pain Orientation Right   Pain Relieving Factors walking, ex meds   Multiple Pain Sites --  7/10 hip right,  bending, flat in beg , turning over on bed,  abduction,                           [facility] Adult PT Treatment/Exercise - 12/19/16 0001      Lumbar Exercises: Stretches   Lower Trunk Rotation 5 reps;10 seconds   Lower Trunk Rotation Limitations legs on ball     Knee/Hip Exercises: Stretches   Passive Hamstring Stretch 3 reps;30 seconds   Quad Stretch Limitations 3 X with quad stretch   Piriformis Stretch 3 reps;30 seconds   Other Knee/Hip Stretches hip abduction , ER stretch,  IR stretch,      Knee/Hip Exercises: Supine   Quad Sets 1 set;10 reps   Quad Sets Limitations pressing into 3 inch roll     Moist Heat Therapy   Number Minutes Moist Heat 20 Minutes  concurrent with some stretching   Moist Heat Location --  right groin                PT Education - 12/19/16 0853    Education provided Yes   Education Details Use of heat to assist pain and flexibility   Person(s) Educated Patient   Methods Explanation   Comprehension Verbalized understanding             PT Long Term Goals - 12/19/16 0854      PT LONG TERM GOAL #1   Title She will be independent with all HEP issued   Baseline doing some,  walking more 3000 to 5000 steps a day   Time 4   Period Weeks   Status On-going     PT LONG TERM GOAL #2   Title She will  report 50% decrease in frequency and intensity of pain    Time 4   Period Weeks   Status Unable   to assess     PT LONG TERM GOAL #3   Title she will report improved sleep due to decr pain.    Time 4   Period Weeks   Status Unable to assess     PT LONG TERM GOAL #4   Title She will improve RT knee extension to -10 degrees   Time 4   Period Weeks   Status Unable to assess     PT LONG TERM GOAL #5   Title She will improve RT knee flexion to 95 degrees   Time 4   Period Weeks   Status Unable to assess               Plan - 12/19/16 9977    Clinical Impression Statement pain decreased to 5/10 from 7/10 at groin right.  Sitting hip flexion post session with increased ROm and less pain. Patient wants to schedule missed appointments.  She needs to see Richardson Landry to extend her POC.   PT Treatment/Interventions Ultrasound;Dry needling;Iontophoresis 76m/ml Dexamethasone;Manual techniques;Taping;Patient/family education;Therapeutic exercise   PT Next Visit Plan continue with Exercises,  She will bring handout to each session.  Next Neck/hand section.  Review previous as needed. Consider table slides for abduction and Self mobs to  increase IR.    PT Home Exercise Plan hamstring and calf stretching and knee flexion stretching,  Hip IR/ER/ADD/ ABDuction, SLR, Hip tilt.  UE ROM   Consulted and Agree with Plan of Care Patient      Patient will benefit from skilled therapeutic intervention in order to improve the following deficits and impairments:  Pain, Decreased range of motion, Increased muscle spasms  Visit Diagnosis: Right calf pain  Stiffness of left knee, not elsewhere classified  Stiffness of right knee, not elsewhere classified     Problem List Patient Active Problem List   Diagnosis Date Noted  . Rheumatoid arthritis involving multiple sites with positive rheumatoid factor ([place] 10/07/2015  . GERD (gastroesophageal reflux disease) 10/07/2015  . Seasonal allergies 10/07/2015  . HTN (hypertension) 10/07/2015  . HLD (hyperlipidemia) 10/07/2015  . History of DVT (deep vein thrombosis) 10/07/2015  . History of pulmonary embolism 10/07/2015  . Severe obesity (BMI >= 40) (HCC) 10/07/2015    Michaelah Credeur PTA 12/19/2016, 1:27 PM  [facility] [address]Phone: [phone]  Fax:  [phone] Name: Angel GutzmerMRN: [id number] of Birth: 1May 16, 1962 PHYSICAL THERAPY DISCHARGE SUMMARY  Visits from Start of Care: 6  Current functional level related to goals / functional outcomes: See above . Unknown as she did not return after this visit   Remaining deficits: Unknown   Education / Equipment: HEP Plan:                                                    Patient goals were not met. Patient is being discharged due to not returning since the last visit.  ?????  SNoralee Stain PT   03/06/17   1:44 PM

## 2017-02-16 ENCOUNTER — Other Ambulatory Visit: Payer: Self-pay

## 2017-02-16 MED ORDER — DEXLANSOPRAZOLE 30 MG PO CPDR: 1.0000 | DELAYED_RELEASE_CAPSULE | Freq: Every day | ORAL | 0 refills | Status: DC

## 2017-02-17 ENCOUNTER — Other Ambulatory Visit: Payer: Self-pay | Admitting: Internal Medicine

## 2017-03-06 ENCOUNTER — Other Ambulatory Visit: Payer: Self-pay | Admitting: Internal Medicine

## 2017-03-06 DIAGNOSIS — M0579 Rheumatoid arthritis with rheumatoid factor of multiple sites without organ or systems involvement: Secondary | ICD-10-CM

## 2017-03-16 ENCOUNTER — Other Ambulatory Visit: Payer: Self-pay | Admitting: Internal Medicine

## 2017-03-16 DIAGNOSIS — M0579 Rheumatoid arthritis with rheumatoid factor of multiple sites without organ or systems involvement: Secondary | ICD-10-CM

## 2017-03-22 DIAGNOSIS — M150 Primary generalized (osteo)arthritis: Secondary | ICD-10-CM | POA: Diagnosis not present

## 2017-03-22 DIAGNOSIS — M0609 Rheumatoid arthritis without rheumatoid factor, multiple sites: Secondary | ICD-10-CM | POA: Diagnosis not present

## 2017-03-22 DIAGNOSIS — M2550 Pain in unspecified joint: Secondary | ICD-10-CM | POA: Diagnosis not present

## 2017-03-30 ENCOUNTER — Other Ambulatory Visit: Payer: Self-pay | Admitting: Internal Medicine

## 2017-03-30 DIAGNOSIS — M0579 Rheumatoid arthritis with rheumatoid factor of multiple sites without organ or systems involvement: Secondary | ICD-10-CM

## 2017-03-30 NOTE — Telephone Encounter (Signed)
Last filled 09/23/16 #180 with 1 refill..please advise

## 2017-03-31 LAB — HM COLONOSCOPY

## 2017-04-02 ENCOUNTER — Other Ambulatory Visit: Payer: Self-pay | Admitting: Internal Medicine

## 2017-04-02 NOTE — Telephone Encounter (Signed)
I do not see when this was ever filled by you, please advise

## 2017-05-15 ENCOUNTER — Other Ambulatory Visit: Payer: Self-pay | Admitting: Internal Medicine

## 2017-05-17 NOTE — Telephone Encounter (Signed)
2nd CPE reminder letter mailed

## 2017-05-18 ENCOUNTER — Other Ambulatory Visit: Payer: Self-pay | Admitting: Internal Medicine

## 2017-06-01 ENCOUNTER — Other Ambulatory Visit: Payer: Self-pay

## 2017-06-01 DIAGNOSIS — M0579 Rheumatoid arthritis with rheumatoid factor of multiple sites without organ or systems involvement: Secondary | ICD-10-CM

## 2017-06-01 MED ORDER — CELECOXIB 200 MG PO CAPS: 200.0000 mg | ORAL_CAPSULE | Freq: Every day | ORAL | 0 refills | Status: DC

## 2017-06-07 ENCOUNTER — Ambulatory Visit (INDEPENDENT_AMBULATORY_CARE_PROVIDER_SITE_OTHER): Payer: 59 | Admitting: Internal Medicine

## 2017-06-07 ENCOUNTER — Encounter: Payer: Self-pay | Admitting: Internal Medicine

## 2017-06-07 VITALS — BP 110/76 | HR 54 | Temp 97.9°F | Ht 62.5 in | Wt 250.0 lb

## 2017-06-07 DIAGNOSIS — E559 Vitamin D deficiency, unspecified: Secondary | ICD-10-CM | POA: Diagnosis not present

## 2017-06-07 DIAGNOSIS — I10 Essential (primary) hypertension: Secondary | ICD-10-CM

## 2017-06-07 DIAGNOSIS — E7800 Pure hypercholesterolemia, unspecified: Secondary | ICD-10-CM | POA: Diagnosis not present

## 2017-06-07 DIAGNOSIS — K219 Gastro-esophageal reflux disease without esophagitis: Secondary | ICD-10-CM

## 2017-06-07 DIAGNOSIS — Z86711 Personal history of pulmonary embolism: Secondary | ICD-10-CM

## 2017-06-07 DIAGNOSIS — J302 Other seasonal allergic rhinitis: Secondary | ICD-10-CM

## 2017-06-07 DIAGNOSIS — Z23 Encounter for immunization: Secondary | ICD-10-CM

## 2017-06-07 DIAGNOSIS — Z86718 Personal history of other venous thrombosis and embolism: Secondary | ICD-10-CM | POA: Diagnosis not present

## 2017-06-07 DIAGNOSIS — Z0000 Encounter for general adult medical examination without abnormal findings: Secondary | ICD-10-CM | POA: Diagnosis not present

## 2017-06-07 DIAGNOSIS — Z1231 Encounter for screening mammogram for malignant neoplasm of breast: Secondary | ICD-10-CM | POA: Diagnosis not present

## 2017-06-07 DIAGNOSIS — Z1239 Encounter for other screening for malignant neoplasm of breast: Secondary | ICD-10-CM

## 2017-06-07 DIAGNOSIS — M0579 Rheumatoid arthritis with rheumatoid factor of multiple sites without organ or systems involvement: Secondary | ICD-10-CM

## 2017-06-07 LAB — COMPREHENSIVE METABOLIC PANEL
ALT: 15 U/L (ref 0–35)
AST: 17 U/L (ref 0–37)
Albumin: 4 g/dL (ref 3.5–5.2)
Alkaline Phosphatase: 65 U/L (ref 39–117)
BUN: 10 mg/dL (ref 6–23)
CO2: 30 meq/L (ref 19–32)
Calcium: 9.6 mg/dL (ref 8.4–10.5)
Chloride: 104 mEq/L (ref 96–112)
Creatinine, Ser: 0.84 mg/dL (ref 0.40–1.20)
GFR: 90.17 mL/min (ref >60.00)
GLUCOSE: 94 mg/dL (ref 70–99)
Potassium: 4.5 mEq/L (ref 3.5–5.1)
Sodium: 141 mEq/L (ref 135–145)
Total Bilirubin: 0.6 mg/dL (ref 0.2–1.2)
Total Protein: 7.8 g/dL (ref 6.0–8.3)

## 2017-06-07 LAB — LIPID PANEL
CHOL/HDL RATIO: 3
Cholesterol: 206 mg/dL — ABNORMAL HIGH (ref 0–200)
HDL: 78.3 mg/dL (ref >39.00)
LDL CALC: 114 mg/dL — AB (ref 0–99)
NONHDL: 127.95
Triglycerides: 71 mg/dL (ref 0.0–149.0)
VLDL: 14.2 mg/dL (ref 0.0–40.0)

## 2017-06-07 LAB — CBC
HCT: 46.7 % — ABNORMAL HIGH (ref 36.0–46.0)
HEMOGLOBIN: 15.3 g/dL — AB (ref 12.0–15.0)
MCHC: 32.6 g/dL (ref 30.0–36.0)
MCV: 97 fl (ref 78.0–100.0)
Platelets: 240 10*3/uL (ref 150.0–400.0)
RBC: 4.82 Mil/uL (ref 3.87–5.11)
RDW: 14.7 % (ref 11.5–15.5)
WBC: 3.9 10*3/uL — ABNORMAL LOW (ref 4.0–10.5)

## 2017-06-07 LAB — VITAMIN D 25 HYDROXY (VIT D DEFICIENCY, FRACTURES): VITD: 18.34 ng/mL — ABNORMAL LOW (ref 30.00–100.00)

## 2017-06-07 NOTE — Assessment & Plan Note (Signed)
Controlled on Amlodipine and Atenolol CBC and CMET today Discussed DASH diet and exercise for weight loss

## 2017-06-07 NOTE — Assessment & Plan Note (Signed)
Continue Xarelto prior to travel 

## 2017-06-07 NOTE — Assessment & Plan Note (Signed)
CMET and lipid profile today Encouraged her to consume a low fat diet Continue Atorvastatin, will adjust if needed based on labs. 

## 2017-06-07 NOTE — Patient Instructions (Signed)

## 2017-06-07 NOTE — Assessment & Plan Note (Addendum)
She will continue to follow with rheumatology Continue Celebrex and MTX CBC and CMET today

## 2017-06-07 NOTE — Assessment & Plan Note (Signed)
Continue Xarelto prior to travel

## 2017-06-07 NOTE — Assessment & Plan Note (Signed)
Controlled off meds  Will monitor 

## 2017-06-07 NOTE — Assessment & Plan Note (Signed)
Controlled on Dexilant Discussed how weight loss could help improve reflux CBC and CMET today

## 2017-06-07 NOTE — Progress Notes (Signed)
Subjective:    Patient ID: Angel Lee, female    DOB: 10/18/1960, 57 y.o.   MRN: [id number]  HPI  Pt presents to the clinic today for her annual exam. She is also due to follow up chronic conditions.  Rheumatoid Arthritis: Controlled with Celebrex, MTX. She stopped Enbrel injections. She takes Folic Acid and Robaxin as needed. She follows with.  GERD: Triggered by caffeine. Controlled on Dexilant, she denies breakthrough symptoms.  Seasonal Allergies: Worse in the fall. She does not take anything consistently for her allergies.  HTN: Her BP today is 110/76. She is taking Amlodipine and Atenolol as prescribed. There is no ECG on file.  HLD: Her last LDL was 122, 04/2016. She denies myalgias on Atorvastatin. She does not consume a low fat diet.  History of DVT and PE: She takes Xarelto as needed for travel only. She does not follow with hematology.  Flu: 04/2016 Tetanus: 2012 Pap Smear: 2015, normal [facility]: [facility] Colon Screening: 2013, every  10 years Vision Screening: annually Dentist: annually  Diet: She does eat meat. She consumes fruits and veggies daily. She does eat fried foods. She drinks mostly coffee and water. Exercise: Stationary Bike 30 minutes 2 x week.  Review of Systems      Past Medical History:  Diagnosis Date  . Allergy   . Chicken pox   . DVT (deep venous thrombosis) (HCC) 2011  . GERD (gastroesophageal reflux disease)   . Hyperlipidemia   . Hypertension   . PE (pulmonary embolism) 2011  . Rheumatoid arthritis (HCC)     Current Outpatient Medications  Medication Sig Dispense Refill  . amLODipine (NORVASC) 5 MG tablet Take 1 tablet (5 mg total) by mouth daily. MUST SCHEDULE ANNUAL PHYSICAL 90 tablet 0  . atenolol (TENORMIN) 50 MG tablet TAKE 1 TABLET EVERY DAY 90 tablet 3  . atorvastatin (LIPITOR) 20 MG tablet TAKE 1 TABLET (20 MG TOTAL) BY MOUTH DAILY. 90 tablet 0  . celecoxib (CELEBREX) 200 MG capsule Take 1 capsule (200  mg total) by mouth daily. MUST SCHEDULE ANNUAL PHYSICAL 90 capsule 0  . DEXILANT 30 MG capsule TAKE 1 CAPSULE (30 MG TOTAL) BY MOUTH DAILY. MUST SCHEDULE ANNUAL EXAM FOR DECEMBER 90 capsule 0  . etanercept (ENBREL SURECLICK) 50 MG/ML injection Inject 50 mg into the skin once a week.    . folic acid (FOLVITE) 1 MG tablet TAKE 1 TABLET (1 MG TOTAL) BY MOUTH DAILY. 90 tablet 1  . methocarbamol (ROBAXIN) 500 MG tablet TAKE 1 TABLET (500 MG TOTAL) BY MOUTH 2 (TWO) TIMES DAILY. 180 tablet 1  . methotrexate (RHEUMATREX) 2.5 MG tablet Take 3 tablets (7.5 mg total) by mouth once a week. Caution:Chemotherapy. Protect from light. 36 tablet 1  . ranitidine (ZANTAC) 150 MG tablet Take 150 mg by mouth every morning.    Carlena Hurl 20 MG TABS tablet Take 1 tablet (20 mg total) by mouth See admin instructions. On during travel 30 tablet 1   No current facility-administered medications for this visit.     Allergies  Allergen Reactions  . Azithromycin Nausea Only  . Sulfa Antibiotics Hives  . Typhoid Vaccines Other (See Comments)    Pain, inflammation     Family History  Problem Relation Age of Onset  . Arthritis Mother   . Hyperlipidemia Mother   . Hyperlipidemia Father   . Heart disease Father   . Stroke Father   . Diabetes Father   . Hypertension Brother   .  Prostate cancer Maternal Uncle   . Heart disease Paternal Aunt   . Stroke Paternal Aunt   . Stroke Paternal Uncle   . Hyperlipidemia Maternal Grandmother   . Hyperlipidemia Maternal Grandfather   . Heart disease Maternal Grandfather   . Stroke Maternal Grandfather   . Hypertension Maternal Grandfather   . Hyperlipidemia Paternal Grandmother   . Diabetes Paternal Grandmother   . Hyperlipidemia Paternal Grandfather     Social History   Socioeconomic History  . Marital status: Single    Spouse name: Not on file  . Number of children: Not on file  . Years of education: Not on file  . Highest education level: Not on file  Social  Needs  . Financial resource strain: Not on file  . Food insecurity - worry: Not on file  . Food insecurity - inability: Not on file  . Transportation needs - medical: Not on file  . Transportation needs - non-medical: Not on file  Occupational History  . Not on file  Tobacco Use  . Smoking status: Never Smoker  . Smokeless tobacco: Never Used  Substance and Sexual Activity  . Alcohol use: No  . Drug use: No  . Sexual activity: Not on file  Other Topics Concern  . Not on file  Social History Narrative  . Not on file     Constitutional: Denies fever, malaise, fatigue, headache or abrupt weight changes.  HEENT: Denies eye pain, eye redness, ear pain, ringing in the ears, wax buildup, runny nose, nasal congestion, bloody nose, or sore throat. Respiratory: Denies difficulty breathing, shortness of breath, cough or sputum production.   Cardiovascular: Denies chest pain, chest tightness, palpitations or swelling in the hands or feet.  Gastrointestinal: Denies abdominal pain, bloating, constipation, diarrhea or blood in the stool.  GU: Denies urgency, frequency, pain with urination, burning sensation, blood in urine, odor or discharge. Musculoskeletal: Pt reports intermittent joint pains. Denies decrease in range of motion, difficulty with gait, muscle pain or joint swelling.  Skin: Denies redness, rashes, lesions or ulcercations.  Neurological: Denies dizziness, difficulty with memory, difficulty with speech or problems with balance and coordination.  Psych: Denies anxiety, depression, SI/HI.  No other specific complaints in a complete review of systems (except as listed in HPI above).  Objective:   Physical Exam   BP 110/76   Pulse (!) 54   Temp 97.9 F (36.6 C) (Oral)   Ht 5' 2.5" (1.588 m)   Wt 250 lb (113.4 kg)   SpO2 99%   BMI 45.00 kg/m  Wt Readings from Last 3 Encounters:  06/07/17 250 lb (113.4 kg)  09/29/16 260 lb (117.9 kg)  08/15/16 258 lb 12 oz (117.4 kg)     General: Appears her stated age, obese in NAD. Skin: Warm, dry and intact.  HEENT: Head: normal shape and size; Eyes: sclera white, no icterus, conjunctiva pink, PERRLA and EOMs intact; Ears: Tm's gray and intact, normal light reflex; Throat/Mouth: Teeth present, mucosa pink and moist, no exudate, lesions or ulcerations noted.  Neck:  Neck supple, trachea midline. No masses, lumps or thyromegaly present.  Cardiovascular: Normal rate and rhythm. S1,S2 noted.  No murmur, rubs or gallops noted. No JVD or BLE edema. No carotid bruits noted. Pulmonary/Chest: Normal effort and positive vesicular breath sounds. No respiratory distress. No wheezes, rales or ronchi noted.  Abdomen: Soft and nontender. Normal bowel sounds. No distention or masses noted. Liver, spleen and kidneys non palpable. Musculoskeletal: Strength 5/5 BUE/BLE. No difficulty with  gait.  Neurological: Alert and oriented. Cranial nerves II-XII grossly intact. Coordination normal.  Psychiatric: Mood and affect normal. Behavior is normal. Judgment and thought content normal.    BMET    Component Value Date/Time   NA 139 08/10/2016 0215   K 4.8 08/10/2016 0215   CL 105 08/10/2016 0215   CO2 26 08/10/2016 0215   GLUCOSE 107 (H) 08/10/2016 0215   BUN 11 08/10/2016 0215   CREATININE 0.99 08/10/2016 0215   CALCIUM 9.1 08/10/2016 0215   GFRNONAA >60 08/10/2016 0215   GFRAA >60 08/10/2016 0215    Lipid Panel     Component Value Date/Time   CHOL 218 (H) 05/17/2016 1414   TRIG 54.0 05/17/2016 1414   HDL 85.20 05/17/2016 1414   CHOLHDL 3 05/17/2016 1414   VLDL 10.8 05/17/2016 1414   LDLCALC 122 (H) 05/17/2016 1414    CBC    Component Value Date/Time   WBC 4.3 08/10/2016 0215   RBC 4.57 08/10/2016 0215   HGB 14.0 08/10/2016 0215   HCT 43.6 08/10/2016 0215   PLT 228 08/10/2016 0215   MCV 95.4 08/10/2016 0215   MCH 30.6 08/10/2016 0215   MCHC 32.1 08/10/2016 0215   RDW 14.7 08/10/2016 0215   LYMPHSABS 2.1 08/10/2016  0215   MONOABS 0.4 08/10/2016 0215   EOSABS 0.1 08/10/2016 0215   BASOSABS 0.0 08/10/2016 0215    Hgb A1C Lab Results  Component Value Date   HGBA1C 5.9 05/17/2016           Assessment & Plan:   Preventative Health Maintenance:  Flu shot today Tetanus UTD Pap smear due 2020 Mammogram ordered, [facility] Colon screening UTD Encouraged her to consume a balanced diet and exercise regimen Advised her to see an eye doctor and dentist annually Will check CBC, CMET, Lipid and Vit D today  RTC in 1 year, sooner if needed Nicki Reaper, NP

## 2017-06-07 NOTE — Addendum Note (Signed)
Addended by: Lorre Munroe on: 06/07/2017 11:11 AM   Modules accepted: Orders

## 2017-06-07 NOTE — Addendum Note (Signed)
Addended by: Roena Malady on: 06/07/2017 11:18 AM   Modules accepted: Orders

## 2017-06-13 ENCOUNTER — Telehealth: Payer: Self-pay | Admitting: Internal Medicine

## 2017-06-13 MED ORDER — AMLODIPINE BESYLATE 5 MG PO TABS: 5.0000 mg | ORAL_TABLET | Freq: Every day | ORAL | 2 refills | Status: DC

## 2017-06-13 MED ORDER — VITAMIN D (ERGOCALCIFEROL) 1.25 MG (50000 UNIT) PO CAPS: 50000.0000 [IU] | ORAL_CAPSULE | ORAL | 0 refills | Status: DC

## 2017-06-13 NOTE — Telephone Encounter (Signed)
Atenolol last filled 03/2017 #90 with 3 refills, so pharmacy should have refills... Amlodipine sent to the pharmacy

## 2017-06-13 NOTE — Addendum Note (Signed)
Addended by: Roena Malady on: 06/13/2017 11:10 AM   Modules accepted: Orders

## 2017-06-13 NOTE — Telephone Encounter (Signed)
Spoke with patient about her results- she would like to get refills on her Amlodipine and her Atenolol.

## 2017-06-14 ENCOUNTER — Ambulatory Visit: Payer: 59 | Attending: Internal Medicine | Admitting: Physical Therapy

## 2017-06-14 ENCOUNTER — Other Ambulatory Visit: Payer: Self-pay

## 2017-06-14 ENCOUNTER — Encounter: Payer: Self-pay | Admitting: Physical Therapy

## 2017-06-14 DIAGNOSIS — M25611 Stiffness of right shoulder, not elsewhere classified: Secondary | ICD-10-CM | POA: Diagnosis present

## 2017-06-14 DIAGNOSIS — Z9181 History of falling: Secondary | ICD-10-CM

## 2017-06-14 DIAGNOSIS — M25671 Stiffness of right ankle, not elsewhere classified: Secondary | ICD-10-CM

## 2017-06-14 DIAGNOSIS — G8929 Other chronic pain: Secondary | ICD-10-CM | POA: Diagnosis present

## 2017-06-14 DIAGNOSIS — M25561 Pain in right knee: Secondary | ICD-10-CM | POA: Diagnosis not present

## 2017-06-14 DIAGNOSIS — M25612 Stiffness of left shoulder, not elsewhere classified: Secondary | ICD-10-CM | POA: Diagnosis present

## 2017-06-14 DIAGNOSIS — M25662 Stiffness of left knee, not elsewhere classified: Secondary | ICD-10-CM | POA: Insufficient documentation

## 2017-06-14 DIAGNOSIS — M79661 Pain in right lower leg: Secondary | ICD-10-CM | POA: Diagnosis present

## 2017-06-14 DIAGNOSIS — M25661 Stiffness of right knee, not elsewhere classified: Secondary | ICD-10-CM | POA: Diagnosis present

## 2017-06-14 NOTE — Therapy (Signed)
[facility] Rehabilitation [facility] [address] [place], [place], [address] Phone: [phone]   Fax:  [phone]  Physical Therapy Evaluation  Patient Details  Name: Angel Lee MRN: [id number] Date of Birth: 14-Jan-1961 Referring Provider: Lorre Munroe, NP   Encounter Date: 06/14/2017  PT End of Session - 06/14/17 1539    Visit Number  1    Number of Visits  13    Date for PT Re-Evaluation  07/27/17    Authorization Type  UHC    PT Start Time  1540    PT Stop Time  1626    PT Time Calculation (min)  46 min    Activity Tolerance  Patient tolerated treatment well    Behavior During Therapy  [facility] for tasks assessed/performed       Past Medical History:  Diagnosis Date  . Allergy   . Chicken pox   . DVT (deep venous thrombosis) (HCC) 2011  . GERD (gastroesophageal reflux disease)   . Hyperlipidemia   . Hypertension   . PE (pulmonary embolism) 2011  . Rheumatoid arthritis [facility])     Past Surgical History:  Procedure Laterality Date  . FOOT SURGERY Right 2003  . KNEE ARTHROSCOPY Bilateral 1981, 1998, 2007  . ROTATOR CUFF REPAIR Bilateral 2000, 2005  . wisdom teeth      There were no vitals filed for this visit.   Subjective Assessment - 06/14/17 1542    Subjective  Bilat RCR, bilat knee surgery and Rt foot. Very active at work. Biggest complaint right now is ROM limiting stairs at stiffness in bilat shoulders. Has stationary bike at home and emphasizes walking. Uses computer all day and gets stiff. Fluidity improves after movement.     How long can you sit comfortably?  stiff to stand after about 30-45 min    Patient Stated Goals  ROM for stairs, increase confidence in Rt leg    Currently in Pain?  No/denies         [facility] PT Assessment - 06/14/17 0001      Assessment   Medical Diagnosis  rheumatoid arthritis    Referring Provider  Lorre Munroe, NP    Onset Date/Surgical Date  -- chronic    Hand Dominance  Right    Prior  Therapy  yes      Precautions   Precaution Comments  RA      Restrictions   Weight Bearing Restrictions  No      Balance Screen   Has the patient fallen in the past 6 months  Yes    How many times?  2      Home Public house manager residence      Prior Function   Level of Independence  Independent      Cognition   Overall Cognitive Status  Within Functional Limits for tasks assessed      Observation/Other Assessments   Focus on Therapeutic Outcomes (FOTO)   -- multiple body parts      Sensation   Additional Comments  WFL      ROM / Strength   AROM / PROM / Strength  AROM;Strength      AROM   AROM Assessment Site  Ankle;Knee    Right/Left Knee  Right    Right Knee Extension  -8    Right Knee Flexion  92    Right/Left Ankle  Right    Right Ankle Dorsiflexion  6    Right  Ankle Plantar Flexion  70      Strength   Overall Strength Comments  grip Rt 65lb, Lt 65lb    Strength Assessment Site  Shoulder;Hip;Knee    Right/Left Shoulder  Right;Left    Right Shoulder Flexion  4+/5 supinated    Right Shoulder External Rotation  4-/5    Left Shoulder External Rotation  4-/5    Right/Left Hip  Right    Right Hip Flexion  4+/5    Right Hip ABduction  4/5    Right/Left Knee  Right    Right Knee Flexion  4+/5    Right Knee Extension  4+/5      Ambulation/Gait   Gait Comments  pain at end range flexion of Rt knee descending stairs, unsure that it will swing through             Objective measurements completed on examination: See above findings.      [facility] Adult PT Treatment/Exercise - 06/14/17 0001      Exercises   Exercises  Knee/Hip      Knee/Hip Exercises: Stretches   Passive Hamstring Stretch Limitations  seated EOB    Hip Flexor Stretch Limitations  thomas test stretch    Knee: Self-Stretch Limitations  heel slides with strap    ITB Stretch Limitations  supine with strap    Piriformis Stretch Limitations  supine figure 4    Soleus  Stretch Limitations  seated EOB             PT Education - 06/14/17 1738    Education provided  Yes    Education Details  anatomy of condition, POC, HEP, exercise form/rationale, stairs review    Person(s) Educated  Patient    Methods  Explanation;Demonstration;Tactile cues;Verbal cues;Handout    Comprehension  Verbalized understanding;Need further instruction;Returned demonstration;Verbal cues required;Tactile cues required          PT Long Term Goals - 06/14/17 1731      PT LONG TERM GOAL #1   Title  Pt will verbalize ability to ascend and descend stairs step over step    Baseline  will sometimes lead with single foot and descends sideways    Time  6    Period  Weeks    Status  New    Target Date  07/27/17      PT LONG TERM GOAL #2   Title  Pt will verbalize having confidence in Rt LE through functional and work activities to improve safety    Baseline  is fearful of falling at eval    Time  6    Period  Weeks    Status  New    Target Date  07/27/17      PT LONG TERM GOAL #3   Title  Pt will demo gross 5/5 MMT in bilat UE & LE for proper biomechanical chain support    Baseline  see flowsheet    Time  6    Period  Weeks    Status  New    Target Date  07/27/17      PT LONG TERM GOAL #4   Title  Pt will be independent with long term HEP and verbalize ability to perform as she travels    Baseline  walks at this time but lacks stretching or upper body regimen    Time  6    Period  Weeks    Status  New    Target Date  07/27/17  PT LONG TERM GOAL #5   Title  Knee ROM to -5-95 for improved functional range    Baseline  see flowsheet    Time  6    Period  Weeks    Status  New    Target Date  07/27/17             Plan - 06/14/17 1726    Clinical Impression Statement  Pt presents to PT with complaints of stiffness, discomfort and limited functional use of Rt LE as well as stiffness in bilateral shoulders. Pt demo good control on stairs but is  fearful of falling. Notable limitation in ROM in knee joint as well as limited DF. Good strength noted overall but would benefit from further challenges. Pt denies regular stretching regimen with exercise which was addressed today. Pt will benefit from skilled PT in order to improve functional use and confidence in use of Rt LE as well as establish proper exercises for shoulders to encourage strength and stability    History and Personal Factors relevant to plan of care:  RA, h/o bilat RCR, multiple knee surgeries, recent falls    Clinical Presentation  Unstable    Clinical Decision Making  High    Rehab Potential  Good    PT Frequency  2x / week    PT Duration  6 weeks    PT Treatment/Interventions  ADLs/Self Care Home Management;Cryotherapy;Electrical Stimulation;Ultrasound;Moist Heat;Iontophoresis 4mg /ml Dexamethasone;Gait training;Stair training;Functional mobility training;Therapeutic activities;Therapeutic exercise;Balance training;Patient/family education;Neuromuscular re-education;Manual techniques;Scar mobilization;Passive range of motion;Taping;Dry needling    PT Next Visit Plan  balance on unstable surfaces, gross shoulder strengthening    PT Home Exercise Plan  stretches: hamstrings, soleus, piriformis, ITB, knee flexion, ;     Consulted and Agree with Plan of Care  Patient       Patient will benefit from skilled therapeutic intervention in order to improve the following deficits and impairments:  Improper body mechanics, Pain, Increased muscle spasms, Decreased activity tolerance, Decreased range of motion, Decreased strength, Impaired UE functional use, Difficulty walking, Decreased balance  Visit Diagnosis: Chronic pain of right knee - Plan: PT plan of care cert/re-cert  Stiffness of right ankle, not elsewhere classified - Plan: PT plan of care cert/re-cert  Stiffness of right shoulder, not elsewhere classified - Plan: PT plan of care cert/re-cert  Stiffness of left  shoulder, not elsewhere classified - Plan: PT plan of care cert/re-cert  History of falling - Plan: PT plan of care cert/re-cert     Problem List Patient Active Problem List   Diagnosis Date Noted  . Rheumatoid arthritis involving multiple sites with positive rheumatoid factor (HCC) 10/07/2015  . GERD (gastroesophageal reflux disease) 10/07/2015  . Seasonal allergies 10/07/2015  . HTN (hypertension) 10/07/2015  . HLD (hyperlipidemia) 10/07/2015  . History of DVT (deep vein thrombosis) 10/07/2015  . History of pulmonary embolism 10/07/2015    Lerlene Treadwell C. Saida Lonon PT, DPT 06/14/17 5:42 PM   [facility] [address] [place], [place], [place] Phone: [phone]   Fax:  [phone]  Name: Angel Lee MRN: Glennon Hamilton Date of Birth: 17-Sep-1960

## 2017-06-18 ENCOUNTER — Ambulatory Visit: Payer: 59 | Admitting: Physical Therapy

## 2017-06-18 ENCOUNTER — Encounter: Payer: Self-pay | Admitting: Physical Therapy

## 2017-06-18 DIAGNOSIS — M79661 Pain in right lower leg: Secondary | ICD-10-CM

## 2017-06-18 DIAGNOSIS — M25561 Pain in right knee: Principal | ICD-10-CM

## 2017-06-18 DIAGNOSIS — G8929 Other chronic pain: Secondary | ICD-10-CM

## 2017-06-18 DIAGNOSIS — M25662 Stiffness of left knee, not elsewhere classified: Secondary | ICD-10-CM

## 2017-06-18 DIAGNOSIS — M25661 Stiffness of right knee, not elsewhere classified: Secondary | ICD-10-CM

## 2017-06-18 DIAGNOSIS — M25612 Stiffness of left shoulder, not elsewhere classified: Secondary | ICD-10-CM

## 2017-06-18 DIAGNOSIS — M25671 Stiffness of right ankle, not elsewhere classified: Secondary | ICD-10-CM

## 2017-06-18 DIAGNOSIS — M25611 Stiffness of right shoulder, not elsewhere classified: Secondary | ICD-10-CM

## 2017-06-18 DIAGNOSIS — Z9181 History of falling: Secondary | ICD-10-CM

## 2017-06-18 NOTE — Therapy (Signed)
[facility] Outpatient Rehabilitation [facility] [address] [place], [place], [address] Phone: [phone]   Fax:  [phone]  Physical Therapy Treatment  Patient Details  Name: Angel Lee MRN: [id number] Date of Birth: [birth year] Referring Provider: Lorre Munroe, NP   Encounter Date: 06/18/2017  PT End of Session - 06/18/17 0856    Visit Number  2    Number of Visits  13    Date for PT Re-Evaluation  07/27/17    Authorization Type  UHC    PT Start Time  [phone] pt arrived late    PT Stop Time  0930    PT Time Calculation (min)  33 min    Activity Tolerance  Patient tolerated treatment well    Behavior During Therapy  [facility] for tasks assessed/performed       Past Medical History:  Diagnosis Date  . Allergy   . Chicken pox   . DVT (deep venous thrombosis) (HCC) 2011  . GERD (gastroesophageal reflux disease)   . Hyperlipidemia   . Hypertension   . PE (pulmonary embolism) 2011  . Rheumatoid arthritis [facility])     Past Surgical History:  Procedure Laterality Date  . FOOT SURGERY Right 2003  . KNEE ARTHROSCOPY Bilateral 1981, 1998, 2007  . ROTATOR CUFF REPAIR Bilateral 2000, 2005  . wisdom teeth      There were no vitals filed for this visit.  Subjective Assessment - 06/18/17 0857    Subjective  Not bad today, had a decent weekend. Had some leg aches so I cleaned my apartment to get moving.     Patient Stated Goals  ROM for stairs, increase confidence in Rt leg    Currently in Pain?  No/denies                      [facility] Adult PT Treatment/Exercise - 06/18/17 0001      Knee/Hip Exercises: Stretches   Passive Hamstring Stretch Limitations  seated EOB    Gastroc Stretch  Both;30 seconds      Knee/Hip Exercises: Aerobic   Nustep  5 min L6 UE & LE      Knee/Hip Exercises: Standing   Rocker Board  Other (comment) A/P & lat, static & dynamic    SLS  bilat without UE support    Other Standing Knee Exercises  tandem stance without UE  support      Knee/Hip Exercises: Sidelying   Clams  x20 each                  PT Long Term Goals - 06/14/17 1731      PT LONG TERM GOAL #1   Title  Pt will verbalize ability to ascend and descend stairs step over step    Baseline  will sometimes lead with single foot and descends sideways    Time  6    Period  Weeks    Status  New    Target Date  07/27/17      PT LONG TERM GOAL #2   Title  Pt will verbalize having confidence in Rt LE through functional and work activities to improve safety    Baseline  is fearful of falling at eval    Time  6    Period  Weeks    Status  New    Target Date  07/27/17      PT LONG TERM GOAL #3   Title  Pt will demo gross 5/5 MMT  in bilat UE & LE for proper biomechanical chain support    Baseline  see flowsheet    Time  6    Period  Weeks    Status  New    Target Date  07/27/17      PT LONG TERM GOAL #4   Title  Pt will be independent with long term HEP and verbalize ability to perform as she travels    Baseline  walks at this time but lacks stretching or upper body regimen    Time  6    Period  Weeks    Status  New    Target Date  07/27/17      PT LONG TERM GOAL #5   Title  Knee ROM to -5-95 for improved functional range    Baseline  see flowsheet    Time  6    Period  Weeks    Status  New    Target Date  07/27/17            Plan - 06/18/17 0920    Clinical Impression Statement  Notable fatigue in standing balance challenges. Exercises to engage core andhip abductors for improve stability control. Pt will be out of town next week for work travel.     PT Treatment/Interventions  ADLs/Self Care Home Management;Cryotherapy;Electrical Stimulation;Ultrasound;Moist Heat;Iontophoresis 4mg /ml Dexamethasone;Gait training;Stair training;Functional mobility training;Therapeutic activities;Therapeutic exercise;Balance training;Patient/family education;Neuromuscular re-education;Manual techniques;Scar mobilization;Passive range of  motion;Taping;Dry needling    PT Next Visit Plan  gross shoulder strengthening, balance with core activation    PT Home Exercise Plan  stretches: hamstrings, soleus, piriformis, ITB, knee flexion, Thomas; clam, post pelvic tilt    Consulted and Agree with Plan of Care  Patient       Patient will benefit from skilled therapeutic intervention in order to improve the following deficits and impairments:  Improper body mechanics, Pain, Increased muscle spasms, Decreased activity tolerance, Decreased range of motion, Decreased strength, Impaired UE functional use, Difficulty walking, Decreased balance  Visit Diagnosis: Chronic pain of right knee  Stiffness of right ankle, not elsewhere classified  Stiffness of right shoulder, not elsewhere classified  Stiffness of left shoulder, not elsewhere classified  History of falling  Right calf pain  Stiffness of left knee, not elsewhere classified  Stiffness of right knee, not elsewhere classified     Problem List Patient Active Problem List   Diagnosis Date Noted  . Rheumatoid arthritis involving multiple sites with positive rheumatoid factor (HCC) 10/07/2015  . GERD (gastroesophageal reflux disease) 10/07/2015  . Seasonal allergies 10/07/2015  . HTN (hypertension) 10/07/2015  . HLD (hyperlipidemia) 10/07/2015  . History of DVT (deep vein thrombosis) 10/07/2015  . History of pulmonary embolism 10/07/2015    Arleny Kruger C. Kade Rickels PT, DPT 06/18/17 9:33 AM   [facility] [address] [place], [place], [place] Phone: [phone]   Fax:  [phone]  Name: Aden Youngman MRN: Glennon Hamilton Date of Birth: 02-23-1961

## 2017-06-28 ENCOUNTER — Other Ambulatory Visit: Payer: Self-pay

## 2017-06-28 DIAGNOSIS — M0579 Rheumatoid arthritis with rheumatoid factor of multiple sites without organ or systems involvement: Secondary | ICD-10-CM

## 2017-06-28 MED ORDER — CELECOXIB 200 MG PO CAPS
200.0000 mg | ORAL_CAPSULE | Freq: Every day | ORAL | 2 refills | Status: DC
Start: 2017-06-28 — End: 2022-09-04

## 2017-07-02 ENCOUNTER — Ambulatory Visit: Payer: 59

## 2017-07-03 ENCOUNTER — Encounter: Payer: Self-pay | Admitting: Physical Therapy

## 2017-07-03 ENCOUNTER — Ambulatory Visit: Payer: 59 | Attending: Internal Medicine | Admitting: Physical Therapy

## 2017-07-03 DIAGNOSIS — M25612 Stiffness of left shoulder, not elsewhere classified: Secondary | ICD-10-CM

## 2017-07-03 DIAGNOSIS — M25662 Stiffness of left knee, not elsewhere classified: Secondary | ICD-10-CM | POA: Diagnosis present

## 2017-07-03 DIAGNOSIS — M25661 Stiffness of right knee, not elsewhere classified: Secondary | ICD-10-CM | POA: Insufficient documentation

## 2017-07-03 DIAGNOSIS — M25611 Stiffness of right shoulder, not elsewhere classified: Secondary | ICD-10-CM

## 2017-07-03 DIAGNOSIS — M25671 Stiffness of right ankle, not elsewhere classified: Secondary | ICD-10-CM

## 2017-07-03 DIAGNOSIS — Z9181 History of falling: Secondary | ICD-10-CM | POA: Diagnosis present

## 2017-07-03 DIAGNOSIS — G8929 Other chronic pain: Secondary | ICD-10-CM

## 2017-07-03 DIAGNOSIS — M25561 Pain in right knee: Secondary | ICD-10-CM | POA: Insufficient documentation

## 2017-07-03 NOTE — Patient Instructions (Addendum)
   Side Pull: Double Arm   On back, knees bent, feet flat. Arms perpendicular to body, shoulder level, elbows straight but relaxed. Pull arms out to sides, elbows straight. Resistance band comes across collarbones, hands toward floor. Hold momentarily. Slowly return to starting position. Repeat __10-20_ times. Band color _R___     Shoulder Rotation: Double Arm   On back, knees bent, feet flat, elbows tucked at sides, bent 90, hands palms up. Pull hands apart and down toward floor, keeping elbows near sides. Hold momentarily. Slowly return to starting position. Repeat 10-20___ times. Band color __R____   SIT TO STAND: No Device    Sit with feet shoulder-width apart, on floor. Lean chest forward, raise hips up from surface. Straighten hips and knees. Weight bear equally on left and right sides. __10_ reps per set, __2_ sets per day, _7__ days per week Place left leg closer to sitting surface.  Copyright  VHI. All rights reserved.

## 2017-07-03 NOTE — Therapy (Signed)
[facility] [address] [place], [place], [address] Phone: [phone]   Fax:  [phone]  Physical Therapy Treatment  Patient Details  Name: Angel Lee MRN: [id number] Date of Birth: 06-23-1960 Referring Provider: Lorre Munroe, NP   Encounter Date: 07/03/2017  PT End of Session - 07/03/17 1552    Visit Number  3    Number of Visits  13    Date for PT Re-Evaluation  07/27/17    Authorization Type  UHC    PT Start Time  0348    PT Stop Time  0427    PT Time Calculation (min)  39 min       Past Medical History:  Diagnosis Date  . Allergy   . Chicken pox   . DVT (deep venous thrombosis) (HCC) 2011  . GERD (gastroesophageal reflux disease)   . Hyperlipidemia   . Hypertension   . PE (pulmonary embolism) 2011  . Rheumatoid arthritis [facility])     Past Surgical History:  Procedure Laterality Date  . FOOT SURGERY Right 2003  . KNEE ARTHROSCOPY Bilateral 1981, 1998, 2007  . ROTATOR CUFF REPAIR Bilateral 2000, 2005  . wisdom teeth      There were no vitals filed for this visit.  Subjective Assessment - 07/03/17 1550    Subjective  Just the shoulder bothering me today.     Currently in Pain?  Yes    Pain Score  6     Pain Location  Shoulder    Pain Orientation  Right    Pain Descriptors / Indicators  Discomfort    Aggravating Factors   reaching, lifting, carrying lap top    Pain Relieving Factors  massaging, biofreeze, ani inflammatories                       [facility] Adult PT Treatment/Exercise - 07/03/17 0001      Knee/Hip Exercises: Stretches   Hip Flexor Stretch Limitations  thomas test stretch    Gastroc Stretch  Both;30 seconds      Knee/Hip Exercises: Standing   Other Standing Knee Exercises  tandem stance without UE support 40 sec left foot back, 20 sec right foot back    Other Standing Knee Exercises  narrow and wide on foam pad , eyes open, eyes closed       Knee/Hip Exercises: Seated    Sit to Sand  10 reps      Knee/Hip Exercises: Supine   Other Supine Knee/Hip Exercises  horizontal abduction, ER, pullovers with red band       Shoulder Exercises: Standing   External Rotation  10 reps    Theraband Level (Shoulder External Rotation)  Level 2 (Red)    Extension  10 reps    Theraband Level (Shoulder Extension)  Level 2 (Red)    Row  10 reps    Theraband Level (Shoulder Row)  Level 2 (Red)             PT Education - 07/03/17 1624    Education provided  Yes    Education Details  HEP    Person(s) Educated  Patient    Methods  Explanation;Handout    Comprehension  Verbalized understanding          PT Long Term Goals - 06/14/17 1731      PT LONG TERM GOAL #1   Title  Pt will verbalize ability to ascend and descend stairs step over step  Baseline  will sometimes lead with single foot and descends sideways    Time  6    Period  Weeks    Status  New    Target Date  07/27/17      PT LONG TERM GOAL #2   Title  Pt will verbalize having confidence in Rt LE through functional and work activities to improve safety    Baseline  is fearful of falling at eval    Time  6    Period  Weeks    Status  New    Target Date  07/27/17      PT LONG TERM GOAL #3   Title  Pt will demo gross 5/5 MMT in bilat UE & LE for proper biomechanical chain support    Baseline  see flowsheet    Time  6    Period  Weeks    Status  New    Target Date  07/27/17      PT LONG TERM GOAL #4   Title  Pt will be independent with long term HEP and verbalize ability to perform as she travels    Baseline  walks at this time but lacks stretching or upper body regimen    Time  6    Period  Weeks    Status  New    Target Date  07/27/17      PT LONG TERM GOAL #5   Title  Knee ROM to -5-95 for improved functional range    Baseline  see flowsheet    Time  6    Period  Weeks    Status  New    Target Date  07/27/17            Plan - 07/03/17 1633    Clinical Impression  Statement  Pt reports compliance with HEP. C/o shoulder pain today at 6-7/10. Began supne scap/shoulder strength with cues to keep in pain free range. Also added sit-stand for knee strengthening.     PT Next Visit Plan  gross shoulder strengthening, balance with core activation    PT Home Exercise Plan  stretches: hamstrings, soleus, piriformis, ITB, knee flexion, Thomas; clam, post pelvic tilt, sit-stand, supine horizontal abduction and ER with red band     Consulted and Agree with Plan of Care  Patient       Patient will benefit from skilled therapeutic intervention in order to improve the following deficits and impairments:  Improper body mechanics, Pain, Increased muscle spasms, Decreased activity tolerance, Decreased range of motion, Decreased strength, Impaired UE functional use, Difficulty walking, Decreased balance  Visit Diagnosis: Chronic pain of right knee  Stiffness of right ankle, not elsewhere classified  Stiffness of right shoulder, not elsewhere classified  Stiffness of left shoulder, not elsewhere classified     Problem List Patient Active Problem List   Diagnosis Date Noted  . Rheumatoid arthritis involving multiple sites with positive rheumatoid factor (HCC) 10/07/2015  . GERD (gastroesophageal reflux disease) 10/07/2015  . Seasonal allergies 10/07/2015  . HTN (hypertension) 10/07/2015  . HLD (hyperlipidemia) 10/07/2015  . History of DVT (deep vein thrombosis) 10/07/2015  . History of pulmonary embolism 10/07/2015    Sherrie Mustache, PTA 07/03/2017, 4:40 PM  [facility] [address] [place], [place], [address] Phone: [phone]   Fax:  [phone]  Name: Angel Lee MRN: [id number] Date of Birth: August 23, 1960

## 2017-07-05 ENCOUNTER — Encounter: Payer: Self-pay | Admitting: Physical Therapy

## 2017-07-05 ENCOUNTER — Ambulatory Visit: Payer: 59 | Admitting: Physical Therapy

## 2017-07-05 DIAGNOSIS — M25611 Stiffness of right shoulder, not elsewhere classified: Secondary | ICD-10-CM

## 2017-07-05 DIAGNOSIS — M25561 Pain in right knee: Principal | ICD-10-CM

## 2017-07-05 DIAGNOSIS — G8929 Other chronic pain: Secondary | ICD-10-CM

## 2017-07-05 DIAGNOSIS — Z9181 History of falling: Secondary | ICD-10-CM

## 2017-07-05 DIAGNOSIS — M25612 Stiffness of left shoulder, not elsewhere classified: Secondary | ICD-10-CM

## 2017-07-05 DIAGNOSIS — M25671 Stiffness of right ankle, not elsewhere classified: Secondary | ICD-10-CM

## 2017-07-05 NOTE — Therapy (Signed)
[facility] [address] [place], [place], [address] Phone: [phone]   Fax:  [phone]  Physical Therapy Treatment  Patient Details  Name: Angel Lee MRN: [id number] Date of Birth: 1960-08-19 Referring Provider: Lorre Munroe, NP   Encounter Date: 07/05/2017  PT End of Session - 07/05/17 0725    Visit Number  4    Number of Visits  13    Date for PT Re-Evaluation  07/27/17    Authorization Type  UHC    PT Start Time  0721    PT Stop Time  0759    PT Time Calculation (min)  38 min       Past Medical History:  Diagnosis Date  . Allergy   . Chicken pox   . DVT (deep venous thrombosis) (HCC) 2011  . GERD (gastroesophageal reflux disease)   . Hyperlipidemia   . Hypertension   . PE (pulmonary embolism) 2011  . Rheumatoid arthritis [facility])     Past Surgical History:  Procedure Laterality Date  . FOOT SURGERY Right 2003  . KNEE ARTHROSCOPY Bilateral 1981, 1998, 2007  . ROTATOR CUFF REPAIR Bilateral 2000, 2005  . wisdom teeth      There were no vitals filed for this visit.  Subjective Assessment - 07/05/17 0723    Subjective  A little stiff and slow in the mornings but I have my routine. I could tell we worked my shoulder last time. Felt it afterward, better today.     Currently in Pain?  No/denies                      [facility] Adult PT Treatment/Exercise - 07/05/17 0001      Knee/Hip Exercises: Aerobic   Nustep  5 min L6 UE & LE      Knee/Hip Exercises: Standing   Other Standing Knee Exercises  tandem stance on foam with head turns, eyes closed, tandem gait forward and retro in // bars.     Other Standing Knee Exercises  narrow base on foam with trunk rotations, core engaged      Knee/Hip Exercises: Seated   Sit to Sand  10 reps      Knee/Hip Exercises: Supine   Bridges  10 reps    Other Supine Knee/Hip Exercises  supine clam reb band, unilateral and bilateral with abdominal brace       Knee/Hip Exercises: Sidelying   Clams  x20 each red band       Shoulder Exercises: Supine   Other Supine Exercises  review of supine horizontal abduction, pullovers, ER x 15 each              PT Education - 07/05/17 0809    Education provided  Yes    Education Details  HEP    Person(s) Educated  Patient    Methods  Explanation;Handout    Comprehension  Verbalized understanding          PT Long Term Goals - 06/14/17 1731      PT LONG TERM GOAL #1   Title  Pt will verbalize ability to ascend and descend stairs step over step    Baseline  will sometimes lead with single foot and descends sideways    Time  6    Period  Weeks    Status  New    Target Date  07/27/17      PT LONG TERM GOAL #2   Title  Pt will  verbalize having confidence in Rt LE through functional and work activities to improve safety    Baseline  is fearful of falling at eval    Time  6    Period  Weeks    Status  New    Target Date  07/27/17      PT LONG TERM GOAL #3   Title  Pt will demo gross 5/5 MMT in bilat UE & LE for proper biomechanical chain support    Baseline  see flowsheet    Time  6    Period  Weeks    Status  New    Target Date  07/27/17      PT LONG TERM GOAL #4   Title  Pt will be independent with long term HEP and verbalize ability to perform as she travels    Baseline  walks at this time but lacks stretching or upper body regimen    Time  6    Period  Weeks    Status  New    Target Date  07/27/17      PT LONG TERM GOAL #5   Title  Knee ROM to -5-95 for improved functional range    Baseline  see flowsheet    Time  6    Period  Weeks    Status  New    Target Date  07/27/17            Plan - 07/05/17 0754    Clinical Impression Statement  SOrenes after adding shoulder exercises however, able to perform HEP and reports no pain today. Continued increaseing balance challenges with core activation. Reviewed shoulder HEP and progressed supine core for proximal stability.      PT Next Visit Plan  gross shoulder strengthening, balance with core activation    PT Home Exercise Plan  stretches: hamstrings, soleus, piriformis, ITB, knee flexion, Thomas; clam, post pelvic tilt,clam red band, sit-stand, supine horizontal abduction and ER with red band     Consulted and Agree with Plan of Care  Patient       Patient will benefit from skilled therapeutic intervention in order to improve the following deficits and impairments:  Improper body mechanics, Pain, Increased muscle spasms, Decreased activity tolerance, Decreased range of motion, Decreased strength, Impaired UE functional use, Difficulty walking, Decreased balance  Visit Diagnosis: Chronic pain of right knee  Stiffness of right ankle, not elsewhere classified  Stiffness of right shoulder, not elsewhere classified  Stiffness of left shoulder, not elsewhere classified  History of falling     Problem List Patient Active Problem List   Diagnosis Date Noted  . Rheumatoid arthritis involving multiple sites with positive rheumatoid factor (HCC) 10/07/2015  . GERD (gastroesophageal reflux disease) 10/07/2015  . Seasonal allergies 10/07/2015  . HTN (hypertension) 10/07/2015  . HLD (hyperlipidemia) 10/07/2015  . History of DVT (deep vein thrombosis) 10/07/2015  . History of pulmonary embolism 10/07/2015    Sherrie Mustache, PTA 07/05/2017, 8:10 AM  [facility] [address] [place], [place], [address] Phone: [phone]   Fax:  [phone]  Name: Angel Lee MRN: [id number] Date of Birth: 06-27-1960

## 2017-07-10 ENCOUNTER — Ambulatory Visit: Payer: 59 | Admitting: Physical Therapy

## 2017-07-12 ENCOUNTER — Encounter: Payer: Self-pay | Admitting: Physical Therapy

## 2017-07-12 ENCOUNTER — Ambulatory Visit: Payer: 59 | Admitting: Physical Therapy

## 2017-07-12 DIAGNOSIS — M0609 Rheumatoid arthritis without rheumatoid factor, multiple sites: Secondary | ICD-10-CM | POA: Diagnosis not present

## 2017-07-12 DIAGNOSIS — M25561 Pain in right knee: Principal | ICD-10-CM

## 2017-07-12 DIAGNOSIS — M25612 Stiffness of left shoulder, not elsewhere classified: Secondary | ICD-10-CM

## 2017-07-12 DIAGNOSIS — G8929 Other chronic pain: Secondary | ICD-10-CM

## 2017-07-12 DIAGNOSIS — M25661 Stiffness of right knee, not elsewhere classified: Secondary | ICD-10-CM

## 2017-07-12 DIAGNOSIS — Z9181 History of falling: Secondary | ICD-10-CM

## 2017-07-12 DIAGNOSIS — M25662 Stiffness of left knee, not elsewhere classified: Secondary | ICD-10-CM

## 2017-07-12 DIAGNOSIS — M25611 Stiffness of right shoulder, not elsewhere classified: Secondary | ICD-10-CM

## 2017-07-12 NOTE — Therapy (Addendum)
[place], [place], [address] Phone: [phone]   Fax:  [phone]  Physical Therapy Treatment/Discharge Summary  Patient Details  Name: Angel Lee MRN: [id number] Date of Birth: 10/28/1960 Referring Provider: Jearld Fenton, NP   Encounter Date: 07/12/2017  PT End of Session - 07/12/17 0729    Visit Number  5    Number of Visits  13    Date for PT Re-Evaluation  07/27/17    Authorization Type  UHC    PT Start Time  0715    PT Stop Time  0750 pt request to leave early    PT Time Calculation (min)  35 min       Past Medical History:  Diagnosis Date  . Allergy   . Chicken pox   . DVT (deep venous thrombosis) ([place]) 2011  . GERD (gastroesophageal reflux disease)   . Hyperlipidemia   . Hypertension   . PE (pulmonary embolism) 2011  . Rheumatoid arthritis [facility])     Past Surgical History:  Procedure Laterality Date  . FOOT SURGERY Right 2003  . KNEE ARTHROSCOPY Bilateral 1981, 1998, 2007  . ROTATOR CUFF REPAIR Bilateral 2000, 2005  . wisdom teeth      There were no vitals filed for this visit.  Subjective Assessment - 07/12/17 0719    Subjective  I would like to focus on the shoulder.     Currently in Pain?  Yes    Pain Score  6     Pain Location  Shoulder    Pain Orientation  Right    Pain Descriptors / Indicators  Sore stiff                      [facility] Adult PT Treatment/Exercise - 07/12/17 0001      Shoulder Exercises: Supine   Protraction  10 reps    Other Supine Exercises  supine scap stab  series red band       Shoulder Exercises: Standing   Internal Rotation  15 reps;Both red    Extension  15 reps    Theraband Level (Shoulder Extension)  Level 2 (Red)    Row  15 reps    Theraband Level (Shoulder Row)  Level 2 (Red)    Other Standing Exercises  Shoulder flexion wall slides x 10  , x5 with lift off       Shoulder Exercises: Stretch   Corner Stretch  3  reps;30 seconds      Manual Therapy   Manual Therapy  Soft tissue mobilization;Passive ROM    Soft tissue mobilization  Serratus/latissimus     Passive ROM  Right shoulder and scap mobs                   PT Long Term Goals - 06/14/17 1731      PT LONG TERM GOAL #1   Title  Pt will verbalize ability to ascend and descend stairs step over step    Baseline  will sometimes lead with single foot and descends sideways    Time  6    Period  Weeks    Status  New    Target Date  07/27/17      PT LONG TERM GOAL #2   Title  Pt will verbalize having confidence in Rt LE through functional and work activities to improve safety    Baseline  is fearful of falling at eval  Time  6    Period  Weeks    Status  New    Target Date  07/27/17      PT LONG TERM GOAL #3   Title  Pt will demo gross 5/5 MMT in bilat UE & LE for proper biomechanical chain support    Baseline  see flowsheet    Time  6    Period  Weeks    Status  New    Target Date  07/27/17      PT LONG TERM GOAL #4   Title  Pt will be independent with long term HEP and verbalize ability to perform as she travels    Baseline  walks at this time but lacks stretching or upper body regimen    Time  6    Period  Weeks    Status  New    Target Date  07/27/17      PT LONG TERM GOAL #5   Title  Knee ROM to -5-95 for improved functional range    Baseline  see flowsheet    Time  6    Period  Weeks    Status  New    Target Date  07/27/17            Plan - 07/12/17 0800    Clinical Impression Statement  Focused right shoulder per pt request. Pain located posterior shoulder/serratus. Performed soft tissue work and scapular mobs. Added supine protraction which improved pain.     PT Next Visit Plan  gross shoulder strengthening, balance with core activation    PT Home Exercise Plan  stretches: hamstrings, soleus, piriformis, ITB, knee flexion, Thomas; clam, post pelvic tilt,clam red band, sit-stand, supine horizontal  abduction and ER with red band     Consulted and Agree with Plan of Care  Patient       Patient will benefit from skilled therapeutic intervention in order to improve the following deficits and impairments:  Improper body mechanics, Pain, Increased muscle spasms, Decreased activity tolerance, Decreased range of motion, Decreased strength, Impaired UE functional use, Difficulty walking, Decreased balance  Visit Diagnosis: Chronic pain of right knee  Stiffness of right shoulder, not elsewhere classified  Stiffness of left shoulder, not elsewhere classified  History of falling  Stiffness of left knee, not elsewhere classified  Stiffness of right knee, not elsewhere classified     Problem List Patient Active Problem List   Diagnosis Date Noted  . Rheumatoid arthritis involving multiple sites with positive rheumatoid factor ([place]) 10/07/2015  . GERD (gastroesophageal reflux disease) 10/07/2015  . Seasonal allergies 10/07/2015  . HTN (hypertension) 10/07/2015  . HLD (hyperlipidemia) 10/07/2015  . History of DVT (deep vein thrombosis) 10/07/2015  . History of pulmonary embolism 10/07/2015    Dorene Ar, PTA 07/12/2017, 8:16 AM  [facility] [address] [place], [place], [address] Phone: [phone]   Fax:  [phone]  Name: Angel Lee MRN: [id number] Date of Birth: 05-01-1961   PHYSICAL THERAPY DISCHARGE SUMMARY  Visits from Start of Care: 5  Current functional level related to goals / functional outcomes: See above   Remaining deficits: See above   Education / Equipment: Anatomy of condition, POC, HEP, exercise form/rationale  Plan: Patient agrees to discharge.  Patient goals were not met. Patient is being discharged due to not returning since the last visit.  ?????     Jessica C. Hightower PT, DPT 08/01/17 10:43 AM

## 2017-07-17 ENCOUNTER — Ambulatory Visit: Payer: 59 | Admitting: Physical Therapy

## 2017-07-17 ENCOUNTER — Ambulatory Visit
Admission: RE | Admit: 2017-07-17 | Discharge: 2017-07-17 | Disposition: A | Discharge status: Home / Self Care | Payer: 59 | Source: Ambulatory Visit | Attending: Internal Medicine | Admitting: Internal Medicine

## 2017-07-17 DIAGNOSIS — Z1231 Encounter for screening mammogram for malignant neoplasm of breast: Secondary | ICD-10-CM | POA: Diagnosis not present

## 2017-07-17 DIAGNOSIS — Z1239 Encounter for other screening for malignant neoplasm of breast: Secondary | ICD-10-CM

## 2017-07-24 ENCOUNTER — Encounter: Payer: 59 | Admitting: Physical Therapy

## 2017-07-26 ENCOUNTER — Ambulatory Visit: Payer: 59 | Admitting: Physical Therapy

## 2017-07-28 DIAGNOSIS — Z0100 Encounter for examination of eyes and vision without abnormal findings: Secondary | ICD-10-CM | POA: Diagnosis not present

## 2017-08-03 DIAGNOSIS — R05 Cough: Secondary | ICD-10-CM | POA: Diagnosis not present

## 2017-08-09 DIAGNOSIS — M150 Primary generalized (osteo)arthritis: Secondary | ICD-10-CM | POA: Diagnosis not present

## 2017-08-09 DIAGNOSIS — M2550 Pain in unspecified joint: Secondary | ICD-10-CM | POA: Diagnosis not present

## 2017-08-09 DIAGNOSIS — M0609 Rheumatoid arthritis without rheumatoid factor, multiple sites: Secondary | ICD-10-CM | POA: Diagnosis not present

## 2017-08-20 ENCOUNTER — Other Ambulatory Visit: Payer: 59

## 2017-08-21 ENCOUNTER — Other Ambulatory Visit: Payer: Self-pay | Admitting: Internal Medicine

## 2017-08-23 ENCOUNTER — Ambulatory Visit: Payer: 59 | Admitting: Internal Medicine

## 2017-08-23 ENCOUNTER — Other Ambulatory Visit (INDEPENDENT_AMBULATORY_CARE_PROVIDER_SITE_OTHER): Payer: 59

## 2017-08-23 ENCOUNTER — Encounter: Payer: Self-pay | Admitting: Internal Medicine

## 2017-08-23 VITALS — BP 118/78 | HR 67 | Temp 97.7°F | Wt 247.5 lb

## 2017-08-23 DIAGNOSIS — J40 Bronchitis, not specified as acute or chronic: Secondary | ICD-10-CM | POA: Diagnosis not present

## 2017-08-23 DIAGNOSIS — E559 Vitamin D deficiency, unspecified: Secondary | ICD-10-CM

## 2017-08-23 LAB — VITAMIN D 25 HYDROXY (VIT D DEFICIENCY, FRACTURES): VITD: 37.61 ng/mL (ref 30.00–100.00)

## 2017-08-23 NOTE — Patient Instructions (Signed)

## 2017-08-23 NOTE — Progress Notes (Signed)
Subjective:    Patient ID: Angel Lee, female    DOB: [birth year], 57 y.o.   MRN: [id number]  HPI  Pt presents to the clinic today for UC follow up. She went to the UC 2 weeks ago. She was diagnosed with bronchitis. She was treated with Hycodan, Doxycyline and Depo Medrol IM. Since that time, she reports the coughing is much better. She denies shortness of breath, fever, chills or body aches. She has not been taking anything additional OTC. She has had sick contacts.  Review of Systems  Past Medical History:  Diagnosis Date  . Allergy   . Chicken pox   . DVT (deep venous thrombosis) (HCC) 2011  . GERD (gastroesophageal reflux disease)   . Hyperlipidemia   . Hypertension   . PE (pulmonary embolism) 2011  . Rheumatoid arthritis (HCC)     Current Outpatient Medications  Medication Sig Dispense Refill  . amLODipine (NORVASC) 5 MG tablet Take 1 tablet (5 mg total) by mouth daily. 90 tablet 2  . atenolol (TENORMIN) 50 MG tablet TAKE 1 TABLET EVERY DAY 90 tablet 3  . atorvastatin (LIPITOR) 20 MG tablet TAKE 1 TABLET (20 MG TOTAL) BY MOUTH DAILY. 90 tablet 0  . celecoxib (CELEBREX) 200 MG capsule Take 1 capsule (200 mg total) by mouth daily. 90 capsule 2  . DEXILANT 30 MG capsule TAKE 1 CAPSULE (30 MG TOTAL) BY MOUTH DAILY. MUST SCHEDULE ANNUAL EXAM FOR DECEMBER 90 capsule 0  . doxycycline (VIBRAMYCIN) 100 MG capsule Take 100 mg by mouth 2 (two) times daily.    . folic acid (FOLVITE) 1 MG tablet TAKE 1 TABLET (1 MG TOTAL) BY MOUTH DAILY. 90 tablet 1  . methocarbamol (ROBAXIN) 500 MG tablet TAKE 1 TABLET (500 MG TOTAL) BY MOUTH 2 (TWO) TIMES DAILY. 180 tablet 1  . methotrexate (RHEUMATREX) 2.5 MG tablet Take 3 tablets (7.5 mg total) by mouth once a week. Caution:Chemotherapy. Protect from light. (Patient taking differently: Take 25 mg by mouth once a week. Caution:Chemotherapy. Protect from light.) 36 tablet 1  . ranitidine (ZANTAC) 150 MG tablet Take 150 mg by mouth every morning.    .  Vitamin D, Ergocalciferol, (DRISDOL) 50000 units CAPS capsule Take 1 capsule (50,000 Units total) by mouth every 7 (seven) days. 12 capsule 0  . XARELTO 20 MG TABS tablet Take 1 tablet (20 mg total) by mouth See admin instructions. On during travel 30 tablet 1   No current facility-administered medications for this visit.     Allergies  Allergen Reactions  . Azithromycin Nausea Only  . Sulfa Antibiotics Hives  . Typhoid Vaccines Other (See Comments)    Pain, inflammation     Family History  Problem Relation Age of Onset  . Arthritis Mother   . Hyperlipidemia Mother   . Hyperlipidemia Father   . Heart disease Father   . Stroke Father   . Diabetes Father   . Hypertension Brother   . Prostate cancer Maternal Uncle   . Heart disease Paternal Aunt   . Stroke Paternal Aunt   . Stroke Paternal Uncle   . Hyperlipidemia Maternal Grandmother   . Hyperlipidemia Maternal Grandfather   . Heart disease Maternal Grandfather   . Stroke Maternal Grandfather   . Hypertension Maternal Grandfather   . Hyperlipidemia Paternal Grandmother   . Diabetes Paternal Grandmother   . Hyperlipidemia Paternal Grandfather     Social History   Socioeconomic History  . Marital status: Single    Spouse name: Not  on file  . Number of children: Not on file  . Years of education: Not on file  . Highest education level: Not on file  Occupational History  . Not on file  Social Needs  . Financial resource strain: Not on file  . Food insecurity:    Worry: Not on file    Inability: Not on file  . Transportation needs:    Medical: Not on file    Non-medical: Not on file  Tobacco Use  . Smoking status: Never Smoker  . Smokeless tobacco: Never Used  Substance and Sexual Activity  . Alcohol use: No  . Drug use: No  . Sexual activity: Not on file  Lifestyle  . Physical activity:    Days per week: Not on file    Minutes per session: Not on file  . Stress: Not on file  Relationships  . Social  connections:    Talks on phone: Not on file    Gets together: Not on file    Attends religious service: Not on file    Active member of club or organization: Not on file    Attends meetings of clubs or organizations: Not on file    Relationship status: Not on file  . Intimate partner violence:    Fear of current or ex partner: Not on file    Emotionally abused: Not on file    Physically abused: Not on file    Forced sexual activity: Not on file  Other Topics Concern  . Not on file  Social History Narrative  . Not on file     Constitutional: Denies fever, malaise, fatigue, headache or abrupt weight changes.  HEENT: Denies eye pain, eye redness, ear pain, ringing in the ears, wax buildup, runny nose, nasal congestion, bloody nose, or sore throat. Respiratory: Pt reports cough. Denies difficulty breathing, shortness of breath, or sputum production.    No other specific complaints in a complete review of systems (except as listed in HPI above).     Objective:   Physical Exam   BP 118/78 (BP Location: Right Arm, Patient Position: Sitting, Cuff Size: Large)   Pulse 67   Temp 97.7 F (36.5 C) (Oral)   Wt 247 lb 8 oz (112.3 kg)   SpO2 97%   BMI 44.55 kg/m  Wt Readings from Last 3 Encounters:  08/23/17 247 lb 8 oz (112.3 kg)  06/07/17 250 lb (113.4 kg)  09/29/16 260 lb (117.9 kg)    General: Appears her stated age, obese in NAD. Neck:  No adenopathy noted. Pulmonary/Chest: Normal effort and positive vesicular breath sounds. No respiratory distress. No wheezes, rales or ronchi noted.   BMET    Component Value Date/Time   NA 141 06/07/2017 1047   K 4.5 06/07/2017 1047   CL 104 06/07/2017 1047   CO2 30 06/07/2017 1047   GLUCOSE 94 06/07/2017 1047   BUN 10 06/07/2017 1047   CREATININE 0.84 06/07/2017 1047   CALCIUM 9.6 06/07/2017 1047   GFRNONAA >60 08/10/2016 0215   GFRAA >60 08/10/2016 0215    Lipid Panel     Component Value Date/Time   CHOL 206 (H) 06/07/2017  1047   TRIG 71.0 06/07/2017 1047   HDL 78.30 06/07/2017 1047   CHOLHDL 3 06/07/2017 1047   VLDL 14.2 06/07/2017 1047   LDLCALC 114 (H) 06/07/2017 1047    CBC    Component Value Date/Time   WBC 3.9 (L) 06/07/2017 1047   RBC 4.82 06/07/2017 1047  HGB 15.3 (H) 06/07/2017 1047   HCT 46.7 (H) 06/07/2017 1047   PLT 240.0 06/07/2017 1047   MCV 97.0 06/07/2017 1047   MCH 30.6 08/10/2016 0215   MCHC 32.6 06/07/2017 1047   RDW 14.7 06/07/2017 1047   LYMPHSABS 2.1 08/10/2016 0215   MONOABS 0.4 08/10/2016 0215   EOSABS 0.1 08/10/2016 0215   BASOSABS 0.0 08/10/2016 0215    Hgb A1C Lab Results  Component Value Date   HGBA1C 5.9 05/17/2016           Assessment & Plan:   UC Follow Up for Bronchitis:  Resolved No need for additional abx at this time Monitor  Return precautions discussed Nicki Reaper, NP

## 2017-08-28 ENCOUNTER — Other Ambulatory Visit: Payer: Self-pay | Admitting: Internal Medicine

## 2017-08-28 DIAGNOSIS — E559 Vitamin D deficiency, unspecified: Secondary | ICD-10-CM

## 2017-09-07 DIAGNOSIS — M0579 Rheumatoid arthritis with rheumatoid factor of multiple sites without organ or systems involvement: Secondary | ICD-10-CM | POA: Diagnosis not present

## 2017-09-07 DIAGNOSIS — M0689 Other specified rheumatoid arthritis, multiple sites: Secondary | ICD-10-CM | POA: Diagnosis not present

## 2017-09-07 DIAGNOSIS — M0609 Rheumatoid arthritis without rheumatoid factor, multiple sites: Secondary | ICD-10-CM | POA: Diagnosis not present

## 2017-09-07 DIAGNOSIS — Z79899 Other long term (current) drug therapy: Secondary | ICD-10-CM | POA: Diagnosis not present

## 2017-09-17 ENCOUNTER — Other Ambulatory Visit: Payer: Self-pay | Admitting: Internal Medicine

## 2017-09-17 DIAGNOSIS — E559 Vitamin D deficiency, unspecified: Secondary | ICD-10-CM

## 2017-10-01 ENCOUNTER — Encounter: Payer: Self-pay | Admitting: Family Medicine

## 2017-10-01 ENCOUNTER — Ambulatory Visit: Payer: 59 | Admitting: Family Medicine

## 2017-10-01 VITALS — BP 124/80 | HR 74 | Temp 98.6°F | Ht 62.5 in | Wt 244.5 lb

## 2017-10-01 DIAGNOSIS — J111 Influenza due to unidentified influenza virus with other respiratory manifestations: Secondary | ICD-10-CM | POA: Diagnosis not present

## 2017-10-01 DIAGNOSIS — R05 Cough: Secondary | ICD-10-CM

## 2017-10-01 DIAGNOSIS — R059 Cough, unspecified: Secondary | ICD-10-CM

## 2017-10-01 LAB — POC INFLUENZA A&B (BINAX/QUICKVUE)
Influenza A, POC: NEGATIVE
Influenza B, POC: NEGATIVE

## 2017-10-01 MED ORDER — PROMETHAZINE-DM 6.25-15 MG/5ML PO SYRP: 5.0000 mL | ORAL_SOLUTION | Freq: Four times a day (QID) | ORAL | 0 refills | Status: DC | PRN

## 2017-10-01 MED ORDER — OSELTAMIVIR PHOSPHATE 75 MG PO CAPS: 75.0000 mg | ORAL_CAPSULE | Freq: Two times a day (BID) | ORAL | 0 refills | Status: DC

## 2017-10-01 NOTE — Patient Instructions (Signed)
Get your rest  Lots of fluids  Hold the methotrexate  Take tylenol for fever/aches  tamiflu as directed  Wear mask to prevent transmission   Try the prometh-dm for cough   Update if not starting to improve in a week or if worsening

## 2017-10-01 NOTE — Progress Notes (Signed)
Subjective:    Patient ID: Angel Lee, female    DOB: March 18, 1961, 57 y.o.   MRN: [id number]  HPI Here with flu like symptoms incl congestion and cough and fever/chills body aches and headache   Symptoms started Wednesday night (out of town)  Ate out 2 hours later -chills/aches all night  Exhausted -pushing herself to work   Today stiff all over/chest is sore from coughing Dry/deep cough  Nose is stuffy  Headache- in the front around eyes   Green nasal d/c   Also has RA and it is flaring   Temp is 98.6 today   Pulse ox 95%%  Was seen by NP Baity on 3/28 for bronchitis -did get better   otc : has not taken anything besides tylenol   Did have a flu shot   Results for orders placed or performed in visit on 10/01/17  POC Influenza A&B(BINAX/QUICKVUE)  Result Value Ref Range   Influenza A, POC Negative Negative   Influenza B, POC Negative Negative   suspect this is a false neg     Patient Active Problem List   Diagnosis Date Noted  . Influenza 10/01/2017  . Rheumatoid arthritis involving multiple sites with positive rheumatoid factor (HCC) 10/07/2015  . GERD (gastroesophageal reflux disease) 10/07/2015  . Seasonal allergies 10/07/2015  . HTN (hypertension) 10/07/2015  . HLD (hyperlipidemia) 10/07/2015  . History of DVT (deep vein thrombosis) 10/07/2015  . History of pulmonary embolism 10/07/2015   Past Medical History:  Diagnosis Date  . Allergy   . Chicken pox   . DVT (deep venous thrombosis) (HCC) 2011  . GERD (gastroesophageal reflux disease)   . Hyperlipidemia   . Hypertension   . PE (pulmonary embolism) 2011  . Rheumatoid arthritis [facility])    Past Surgical History:  Procedure Laterality Date  . FOOT SURGERY Right 2003  . KNEE ARTHROSCOPY Bilateral 1981, 1998, 2007  . ROTATOR CUFF REPAIR Bilateral 2000, 2005  . wisdom teeth     Social History   Tobacco Use  . Smoking status: Never Smoker  . Smokeless tobacco: Never Used  Substance Use Topics    . Alcohol use: No  . Drug use: No   Family History  Problem Relation Age of Onset  . Arthritis Mother   . Hyperlipidemia Mother   . Hyperlipidemia Father   . Heart disease Father   . Stroke Father   . Diabetes Father   . Hypertension Brother   . Prostate cancer Maternal Uncle   . Heart disease Paternal Aunt   . Stroke Paternal Aunt   . Stroke Paternal Uncle   . Hyperlipidemia Maternal Grandmother   . Hyperlipidemia Maternal Grandfather   . Heart disease Maternal Grandfather   . Stroke Maternal Grandfather   . Hypertension Maternal Grandfather   . Hyperlipidemia Paternal Grandmother   . Diabetes Paternal Grandmother   . Hyperlipidemia Paternal Grandfather    Allergies  Allergen Reactions  . Azithromycin Nausea Only  . Sulfa Antibiotics Hives  . Typhoid Vaccines Other (See Comments)    Pain, inflammation    Current Outpatient Medications on File Prior to Visit  Medication Sig Dispense Refill  . amLODipine (NORVASC) 5 MG tablet Take 1 tablet (5 mg total) by mouth daily. 90 tablet 2  . atenolol (TENORMIN) 50 MG tablet TAKE 1 TABLET EVERY DAY 90 tablet 3  . atorvastatin (LIPITOR) 20 MG tablet TAKE 1 TABLET (20 MG TOTAL) BY MOUTH DAILY. 90 tablet 0  . celecoxib (CELEBREX) 200 MG  capsule Take 1 capsule (200 mg total) by mouth daily. 90 capsule 2  . DEXILANT 30 MG capsule TAKE 1 CAPSULE (30 MG TOTAL) BY MOUTH DAILY. MUST SCHEDULE ANNUAL EXAM FOR DECEMBER 90 capsule 0  . folic acid (FOLVITE) 1 MG tablet TAKE 1 TABLET (1 MG TOTAL) BY MOUTH DAILY. 90 tablet 1  . methocarbamol (ROBAXIN) 500 MG tablet TAKE 1 TABLET (500 MG TOTAL) BY MOUTH 2 (TWO) TIMES DAILY. 180 tablet 1  . methotrexate 50 MG/2ML injection Inject 30 mg/m2 into the muscle once a week.   0  . ranitidine (ZANTAC) 150 MG tablet Take 150 mg by mouth every morning.    . Vitamin D, Ergocalciferol, (DRISDOL) 50000 units CAPS capsule Take 1 capsule (50,000 Units total) by mouth every 7 (seven) days. 12 capsule 0  . XARELTO  20 MG TABS tablet Take 1 tablet (20 mg total) by mouth See admin instructions. On during travel 30 tablet 1   No current facility-administered medications on file prior to visit.     Review of Systems  Constitutional: Positive for appetite change, fatigue and fever.  HENT: Positive for congestion, postnasal drip, rhinorrhea, sinus pressure, sneezing and sore throat. Negative for ear pain.   Eyes: Negative for pain and discharge.  Respiratory: Positive for cough. Negative for shortness of breath, wheezing and stridor.   Cardiovascular: Negative for chest pain.  Gastrointestinal: Negative for diarrhea, nausea and vomiting.  Genitourinary: Negative for frequency, hematuria and urgency.  Musculoskeletal: Negative for arthralgias and myalgias.  Skin: Negative for rash.  Neurological: Positive for headaches. Negative for dizziness, weakness and light-headedness.  Psychiatric/Behavioral: Negative for confusion and dysphoric mood.       Objective:   Physical Exam  Constitutional: She appears well-developed and well-nourished. No distress.  Obese and fatigued appearing   HENT:  Head: Normocephalic and atraumatic.  Right Ear: External ear normal.  Left Ear: External ear normal.  Mouth/Throat: Oropharynx is clear and moist.  Nares are injected and congested  No sinus tenderness Clear rhinorrhea and post nasal drip   Eyes: Pupils are equal, round, and reactive to light. Conjunctivae and EOM are normal. Right eye exhibits no discharge. Left eye exhibits no discharge.  Nl conj  Neck: Normal range of motion. Neck supple.  Cardiovascular: Normal rate and normal heart sounds.  Pulmonary/Chest: Effort normal and breath sounds normal. No respiratory distress. She has no wheezes. She has no rales. She exhibits no tenderness.  Harsh bs Good air exch No rales/rhonchi or wheeze   Lymphadenopathy:    She has no cervical adenopathy.  Neurological: She is alert.  Skin: Skin is warm and dry. No rash  noted.  Psychiatric: She has a normal mood and affect.          Assessment & Plan:   Problem List Items Addressed This Visit      Respiratory   Influenza - Primary    I suspect the rapid flu test is false neg given symptoms  tamiflu (starting late but pt is on mtx so I feel it is important) prometh-dm for cough Rest/fluids/tylenol prn  Disc symptomatic care - see instructions on AVS Update if not starting to improve in a week or if worsening        Relevant Medications   oseltamivir (TAMIFLU) 75 MG capsule    Other Visit Diagnoses    Cough       Relevant Orders   POC Influenza A&B(BINAX/QUICKVUE) (Completed)

## 2017-10-01 NOTE — Assessment & Plan Note (Signed)
I suspect the rapid flu test is false neg given symptoms  tamiflu (starting late but pt is on mtx so I feel it is important) prometh-dm for cough Rest/fluids/tylenol prn  Disc symptomatic care - see instructions on AVS Update if not starting to improve in a week or if worsening

## 2017-11-06 DIAGNOSIS — Z79899 Other long term (current) drug therapy: Secondary | ICD-10-CM | POA: Diagnosis not present

## 2017-11-06 DIAGNOSIS — M0609 Rheumatoid arthritis without rheumatoid factor, multiple sites: Secondary | ICD-10-CM | POA: Diagnosis not present

## 2017-11-06 DIAGNOSIS — M2550 Pain in unspecified joint: Secondary | ICD-10-CM | POA: Diagnosis not present

## 2017-11-06 DIAGNOSIS — M150 Primary generalized (osteo)arthritis: Secondary | ICD-10-CM | POA: Diagnosis not present

## 2017-11-13 ENCOUNTER — Other Ambulatory Visit: Payer: Self-pay | Admitting: Internal Medicine

## 2017-12-14 ENCOUNTER — Other Ambulatory Visit: Payer: Self-pay | Admitting: Internal Medicine

## 2017-12-14 NOTE — Telephone Encounter (Signed)
Last filled 09/25/2016 w/ 1 refill... Please advise

## 2017-12-17 ENCOUNTER — Telehealth: Payer: Self-pay | Admitting: Internal Medicine

## 2017-12-17 NOTE — Telephone Encounter (Signed)
Copied from CRM [id number]. Topic: Quick Communication - Rx Refill/Question >> Dec 17, 2017 10:34 AM Herby Abraham C wrote: Medication: XARELTO 20 MG TABS tablet   Has the patient contacted their pharmacy? No  (Agent: If no, request that the patient contact the pharmacy for the refill.) (Agent: If yes, when and what did the pharmacy advise?)  Preferred Pharmacy (with phone number or street name): [id number] - [place], [place] - [address] AT [address] [facility] [address]  Agent: Please be advised that RX refills may take up to 3 business days. We ask that you follow-up with your pharmacy.

## 2017-12-18 NOTE — Telephone Encounter (Signed)
Rx was filled for patient 12/14/17 by office.

## 2018-02-05 DIAGNOSIS — M2550 Pain in unspecified joint: Secondary | ICD-10-CM | POA: Diagnosis not present

## 2018-02-05 DIAGNOSIS — M0609 Rheumatoid arthritis without rheumatoid factor, multiple sites: Secondary | ICD-10-CM | POA: Diagnosis not present

## 2018-02-05 DIAGNOSIS — Z79899 Other long term (current) drug therapy: Secondary | ICD-10-CM | POA: Diagnosis not present

## 2018-02-05 DIAGNOSIS — M150 Primary generalized (osteo)arthritis: Secondary | ICD-10-CM | POA: Diagnosis not present

## 2018-02-14 DIAGNOSIS — M25511 Pain in right shoulder: Secondary | ICD-10-CM | POA: Diagnosis not present

## 2018-03-02 DIAGNOSIS — M25511 Pain in right shoulder: Secondary | ICD-10-CM | POA: Diagnosis not present

## 2018-03-10 ENCOUNTER — Other Ambulatory Visit: Payer: Self-pay | Admitting: Internal Medicine

## 2018-03-11 DIAGNOSIS — M25511 Pain in right shoulder: Secondary | ICD-10-CM | POA: Diagnosis not present

## 2018-05-11 ENCOUNTER — Other Ambulatory Visit: Payer: Self-pay | Admitting: Internal Medicine

## 2018-05-15 ENCOUNTER — Telehealth: Payer: Self-pay | Admitting: Internal Medicine

## 2018-05-15 NOTE — Telephone Encounter (Signed)
Opened in error, will cal pharmacy

## 2018-06-10 DIAGNOSIS — M0609 Rheumatoid arthritis without rheumatoid factor, multiple sites: Secondary | ICD-10-CM | POA: Diagnosis not present

## 2018-06-10 DIAGNOSIS — M150 Primary generalized (osteo)arthritis: Secondary | ICD-10-CM | POA: Diagnosis not present

## 2018-06-10 DIAGNOSIS — M2550 Pain in unspecified joint: Secondary | ICD-10-CM | POA: Diagnosis not present

## 2018-06-21 ENCOUNTER — Other Ambulatory Visit: Payer: Self-pay | Admitting: Internal Medicine

## 2018-06-30 DIAGNOSIS — R52 Pain, unspecified: Secondary | ICD-10-CM | POA: Diagnosis not present

## 2018-06-30 DIAGNOSIS — A09 Infectious gastroenteritis and colitis, unspecified: Secondary | ICD-10-CM | POA: Diagnosis not present

## 2018-07-05 ENCOUNTER — Encounter: Payer: Self-pay | Admitting: Internal Medicine

## 2018-07-05 ENCOUNTER — Ambulatory Visit (INDEPENDENT_AMBULATORY_CARE_PROVIDER_SITE_OTHER): Payer: 59 | Admitting: Internal Medicine

## 2018-07-05 VITALS — BP 124/80 | HR 66 | Temp 98.0°F | Ht 62.25 in | Wt 253.0 lb

## 2018-07-05 DIAGNOSIS — Z124 Encounter for screening for malignant neoplasm of cervix: Secondary | ICD-10-CM | POA: Diagnosis not present

## 2018-07-05 DIAGNOSIS — L989 Disorder of the skin and subcutaneous tissue, unspecified: Secondary | ICD-10-CM

## 2018-07-05 DIAGNOSIS — K219 Gastro-esophageal reflux disease without esophagitis: Secondary | ICD-10-CM

## 2018-07-05 DIAGNOSIS — Z23 Encounter for immunization: Secondary | ICD-10-CM

## 2018-07-05 DIAGNOSIS — Z0001 Encounter for general adult medical examination with abnormal findings: Secondary | ICD-10-CM

## 2018-07-05 DIAGNOSIS — Z86711 Personal history of pulmonary embolism: Secondary | ICD-10-CM

## 2018-07-05 DIAGNOSIS — E7800 Pure hypercholesterolemia, unspecified: Secondary | ICD-10-CM

## 2018-07-05 DIAGNOSIS — I10 Essential (primary) hypertension: Secondary | ICD-10-CM | POA: Diagnosis not present

## 2018-07-05 DIAGNOSIS — M0579 Rheumatoid arthritis with rheumatoid factor of multiple sites without organ or systems involvement: Secondary | ICD-10-CM

## 2018-07-05 DIAGNOSIS — Z86718 Personal history of other venous thrombosis and embolism: Secondary | ICD-10-CM

## 2018-07-05 NOTE — Progress Notes (Signed)
Subjective:    Patient ID: Angel Lee, female    DOB: March 09, 1961, 58 y.o.   MRN: [id number]  HPI  Pt presents to the clinic today for her annual exam. She is also due to follow up chronic conditions.  RA: She is taking Celebrex, Folic Acid, Robaxin and Methotrexate. She follows with Dr. Nickola MajorHawkes, rheumatology.   GERD: Triggered by caffeine. Controlled with Dexilant. She denies breakthrough symptoms.  HTN: Her BP today is 124/80. She is taking Atenolol, Amlodipine as prescribed. There is no ECG on file.  HLD: Her last LDL was 114, 05/2017. She denies myalgias on Atorvastatin. She tries to consume a low fat diet.  History of DVT and PE: On lifelong Xarelto. No s/s of bleeding.  Flu: 04/2016 Tetanus: 2012 Pap Smear: 2015, wants referral to GYN Mammogram: 06/2017 Colon Screening: 2014, 10 years Vision Screening: annually Dentist: annually  Diet: She does eat lean meats. She consumes fruits and veggies daily. She tries to avoid fried foods. She drinks mostly water. Exercise: Stationary Bike 30 minutes 2 x week  Review of Systems      Past Medical History:  Diagnosis Date  . Allergy   . Chicken pox   . DVT (deep venous thrombosis) (HCC) 2011  . GERD (gastroesophageal reflux disease)   . Hyperlipidemia   . Hypertension   . PE (pulmonary embolism) 2011  . Rheumatoid arthritis (HCC)     Current Outpatient Medications  Medication Sig Dispense Refill  . amLODipine (NORVASC) 5 MG tablet Take 1 tablet (5 mg total) by mouth daily. 90 tablet 0  . atenolol (TENORMIN) 50 MG tablet Take 1 tablet (50 mg total) by mouth daily. MUST SCHEDULE ANNUAL EXAM Jan 2020 90 tablet 0  . atorvastatin (LIPITOR) 20 MG tablet TAKE 1 TABLET (20 MG TOTAL) BY MOUTH DAILY. 90 tablet 1  . celecoxib (CELEBREX) 200 MG capsule Take 1 capsule (200 mg total) by mouth daily. 90 capsule 2  . Dexlansoprazole (DEXILANT) 30 MG capsule Take 1 capsule (30 mg total) by mouth daily. MUST SCHEDULE ANNUAL EXAM 90  capsule 0  . folic acid (FOLVITE) 1 MG tablet TAKE 1 TABLET (1 MG TOTAL) BY MOUTH DAILY. 90 tablet 1  . methocarbamol (ROBAXIN) 500 MG tablet TAKE 1 TABLET (500 MG TOTAL) BY MOUTH 2 (TWO) TIMES DAILY. 180 tablet 1  . methotrexate 50 MG/2ML injection Inject 30 mg/m2 into the muscle once a week.   0  . oseltamivir (TAMIFLU) 75 MG capsule Take 1 capsule (75 mg total) by mouth 2 (two) times daily. 10 capsule 0  . promethazine-dextromethorphan (PROMETHAZINE-DM) 6.25-15 MG/5ML syrup Take 5 mLs by mouth 4 (four) times daily as needed for cough. Caution of sedation 118 mL 0  . ranitidine (ZANTAC) 150 MG tablet Take 150 mg by mouth every morning.    . Vitamin D, Ergocalciferol, (DRISDOL) 50000 units CAPS capsule Take 1 capsule (50,000 Units total) by mouth every 7 (seven) days. 12 capsule 0  . XARELTO 20 MG TABS tablet Take 1 tablet (20 mg total) by mouth daily with supper. 90 tablet 1   No current facility-administered medications for this visit.     Allergies  Allergen Reactions  . Azithromycin Nausea Only  . Sulfa Antibiotics Hives  . Typhoid Vaccines Other (See Comments)    Pain, inflammation     Family History  Problem Relation Age of Onset  . Arthritis Mother   . Hyperlipidemia Mother   . Hyperlipidemia Father   . Heart disease Father   .  Stroke Father   . Diabetes Father   . Hypertension Brother   . Prostate cancer Maternal Uncle   . Heart disease Paternal Aunt   . Stroke Paternal Aunt   . Stroke Paternal Uncle   . Hyperlipidemia Maternal Grandmother   . Hyperlipidemia Maternal Grandfather   . Heart disease Maternal Grandfather   . Stroke Maternal Grandfather   . Hypertension Maternal Grandfather   . Hyperlipidemia Paternal Grandmother   . Diabetes Paternal Grandmother   . Hyperlipidemia Paternal Grandfather     Social History   Socioeconomic History  . Marital status: Single    Spouse name: Not on file  . Number of children: Not on file  . Years of education: Not on  file  . Highest education level: Not on file  Occupational History  . Not on file  Social Needs  . Financial resource strain: Not on file  . Food insecurity:    Worry: Not on file    Inability: Not on file  . Transportation needs:    Medical: Not on file    Non-medical: Not on file  Tobacco Use  . Smoking status: Never Smoker  . Smokeless tobacco: Never Used  Substance and Sexual Activity  . Alcohol use: No  . Drug use: No  . Sexual activity: Not on file  Lifestyle  . Physical activity:    Days per week: Not on file    Minutes per session: Not on file  . Stress: Not on file  Relationships  . Social connections:    Talks on phone: Not on file    Gets together: Not on file    Attends religious service: Not on file    Active member of club or organization: Not on file    Attends meetings of clubs or organizations: Not on file    Relationship status: Not on file  . Intimate partner violence:    Fear of current or ex partner: Not on file    Emotionally abused: Not on file    Physically abused: Not on file    Forced sexual activity: Not on file  Other Topics Concern  . Not on file  Social History Narrative  . Not on file     Constitutional: Denies fever, malaise, fatigue, headache or abrupt weight changes.  HEENT: Denies eye pain, eye redness, ear pain, ringing in the ears, wax buildup, runny nose, nasal congestion, bloody nose, or sore throat. Respiratory: Denies difficulty breathing, shortness of breath, cough or sputum production.   Cardiovascular: Denies chest pain, chest tightness, palpitations or swelling in the hands or feet.  Gastrointestinal: Denies abdominal pain, bloating, constipation, diarrhea or blood in the stool.  GU: Denies urgency, frequency, pain with urination, burning sensation, blood in urine, odor or discharge. Musculoskeletal: Pt reports chronic joint pain. Denies decrease in range of motion, difficulty with gait, muscle pain or joint swelling.    Skin: Denies redness, rashes, lesions or ulcercations.  Neurological: Denies dizziness, difficulty with memory, difficulty with speech or problems with balance and coordination.  Psych: Denies anxiety, depression, SI/HI.  No other specific complaints in a complete review of systems (except as listed in HPI above).  Objective:   Physical Exam   BP 124/80   Pulse 66   Temp 98 F (36.7 C) (Oral)   Ht 5' 2.25" (1.581 m)   Wt 253 lb (114.8 kg)   SpO2 98%   BMI 45.90 kg/m   Wt Readings from Last 3 Encounters:  10/01/17 244  lb 8 oz (110.9 kg)  08/23/17 247 lb 8 oz (112.3 kg)  06/07/17 250 lb (113.4 kg)    General: Appears her stated age, obese, in NAD. Skin: Warm, dry and intact. Round, raised, brown lesion noted of temple area. Appears to be an SK. HEENT: Head: normal shape and size; Eyes: sclera white, no icterus, conjunctiva pink, PERRLA and EOMs intact; Ears: bilateral cerumen impaction; Throat/Mouth: Teeth present, mucosa pink and moist, no exudate, lesions or ulcerations noted.  Neck:  Neck supple, trachea midline. No masses, lumps or thyromegaly present.  Cardiovascular: Normal rate and rhythm. S1,S2 noted.  No murmur, rubs or gallops noted. No JVD or BLE edema. No carotid bruits noted. Pulmonary/Chest: Normal effort and positive vesicular breath sounds. No respiratory distress. No wheezes, rales or ronchi noted.  Abdomen: Soft an.d nontender. Normal bowel sounds. No distention or masses noted. Liver, spleen and kidneys non palpable. Musculoskeletal: Strength 5/5 BUE/BLE No difficulty with gait.  Neurological: Alert and oriented. Cranial nerves II-XII grossly intact. Coordination normal.  Psychiatric: Mood and affect normal. Behavior is normal. Judgment and thought content normal.    BMET    Component Value Date/Time   NA 141 06/07/2017 1047   K 4.5 06/07/2017 1047   CL 104 06/07/2017 1047   CO2 30 06/07/2017 1047   GLUCOSE 94 06/07/2017 1047   BUN 10 06/07/2017 1047    CREATININE 0.84 06/07/2017 1047   CALCIUM 9.6 06/07/2017 1047   GFRNONAA >60 08/10/2016 0215   GFRAA >60 08/10/2016 0215    Lipid Panel     Component Value Date/Time   CHOL 206 (H) 06/07/2017 1047   TRIG 71.0 06/07/2017 1047   HDL 78.30 06/07/2017 1047   CHOLHDL 3 06/07/2017 1047   VLDL 14.2 06/07/2017 1047   LDLCALC 114 (H) 06/07/2017 1047    CBC    Component Value Date/Time   WBC 3.9 (L) 06/07/2017 1047   RBC 4.82 06/07/2017 1047   HGB 15.3 (H) 06/07/2017 1047   HCT 46.7 (H) 06/07/2017 1047   PLT 240.0 06/07/2017 1047   MCV 97.0 06/07/2017 1047   MCH 30.6 08/10/2016 0215   MCHC 32.6 06/07/2017 1047   RDW 14.7 06/07/2017 1047   LYMPHSABS 2.1 08/10/2016 0215   MONOABS 0.4 08/10/2016 0215   EOSABS 0.1 08/10/2016 0215   BASOSABS 0.0 08/10/2016 0215    Hgb A1C Lab Results  Component Value Date   HGBA1C 5.9 05/17/2016           Assessment & Plan:   Preventative Health Maintenance:  Flu shot today Tetanus UTD Referral to GYN for pap smear She will call to schedule mammogram Colon screening UTD Encouraged her to consume a balanced diet and exercise regimen Advised her to see an eye doctor and dentist annually Will request labs from rheumatology  Skin Lesion of Face:  Likely SK Referral to dermatology per pt request  RTC in 1 year, sooner if needed Nicki Reaper, NP

## 2018-07-07 NOTE — Patient Instructions (Signed)
Health Maintenance for Postmenopausal Women Menopause is a normal process in which your reproductive ability comes to an end. This process happens gradually over a span of months to years, usually between the ages of 62 and 89. Menopause is complete when you have missed 12 consecutive menstrual periods. It is important to talk with your health care provider about some of the most common conditions that affect postmenopausal women, such as heart disease, cancer, and bone loss (osteoporosis). Adopting a healthy lifestyle and getting preventive care can help to promote your health and wellness. Those actions can also lower your chances of developing some of these common conditions. What should I know about menopause? During menopause, you may experience a number of symptoms, such as:  Moderate-to-severe hot flashes.  Night sweats.  Decrease in sex drive.  Mood swings.  Headaches.  Tiredness.  Irritability.  Memory problems.  Insomnia. Choosing to treat or not to treat menopausal changes is an individual decision that you make with your health care provider. What should I know about hormone replacement therapy and supplements? Hormone therapy products are effective for treating symptoms that are associated with menopause, such as hot flashes and night sweats. Hormone replacement carries certain risks, especially as you become older. If you are thinking about using estrogen or estrogen with progestin treatments, discuss the benefits and risks with your health care provider. What should I know about heart disease and stroke? Heart disease, heart attack, and stroke become more likely as you age. This may be due, in part, to the hormonal changes that your body experiences during menopause. These can affect how your body processes dietary fats, triglycerides, and cholesterol. Heart attack and stroke are both medical emergencies. There are many things that you can do to help prevent heart disease  and stroke:  Have your blood pressure checked at least every 1-2 years. High blood pressure causes heart disease and increases the risk of stroke.  If you are 79-72 years old, ask your health care provider if you should take aspirin to prevent a heart attack or a stroke.  Do not use any tobacco products, including cigarettes, chewing tobacco, or electronic cigarettes. If you need help quitting, ask your health care provider.  It is important to eat a healthy diet and maintain a healthy weight. ? Be sure to include plenty of vegetables, fruits, low-fat dairy products, and lean protein. ? Avoid eating foods that are high in solid fats, added sugars, or salt (sodium).  Get regular exercise. This is one of the most important things that you can do for your health. ? Try to exercise for at least 150 minutes each week. The type of exercise that you do should increase your heart rate and make you sweat. This is known as moderate-intensity exercise. ? Try to do strengthening exercises at least twice each week. Do these in addition to the moderate-intensity exercise.  Know your numbers.Ask your health care provider to check your cholesterol and your blood glucose. Continue to have your blood tested as directed by your health care provider.  What should I know about cancer screening? There are several types of cancer. Take the following steps to reduce your risk and to catch any cancer development as early as possible. Breast Cancer  Practice breast self-awareness. ? This means understanding how your breasts normally appear and feel. ? It also means doing regular breast self-exams. Let your health care provider know about any changes, no matter how small.  If you are 40 or  older, have a clinician do a breast exam (clinical breast exam or CBE) every year. Depending on your age, family history, and medical history, it may be recommended that you also have a yearly breast X-ray (mammogram).  If you  have a family history of breast cancer, talk with your health care provider about genetic screening.  If you are at high risk for breast cancer, talk with your health care provider about having an MRI and a mammogram every year.  Breast cancer (BRCA) gene test is recommended for women who have family members with BRCA-related cancers. Results of the assessment will determine the need for genetic counseling and BRCA1 and for BRCA2 testing. BRCA-related cancers include these types: ? Breast. This occurs in males or females. ? Ovarian. ? Tubal. This may also be called fallopian tube cancer. ? Cancer of the abdominal or pelvic lining (peritoneal cancer). ? Prostate. ? Pancreatic. Cervical, Uterine, and Ovarian Cancer Your health care provider may recommend that you be screened regularly for cancer of the pelvic organs. These include your ovaries, uterus, and vagina. This screening involves a pelvic exam, which includes checking for microscopic changes to the surface of your cervix (Pap test).  For women ages 21-65, health care providers may recommend a pelvic exam and a Pap test every three years. For women ages 33-65, they may recommend the Pap test and pelvic exam, combined with testing for human papilloma virus (HPV), every five years. Some types of HPV increase your risk of cervical cancer. Testing for HPV may also be done on women of any age who have unclear Pap test results.  Other health care providers may not recommend any screening for nonpregnant women who are considered low risk for pelvic cancer and have no symptoms. Ask your health care provider if a screening pelvic exam is right for you.  If you have had past treatment for cervical cancer or a condition that could lead to cancer, you need Pap tests and screening for cancer for at least 20 years after your treatment. If Pap tests have been discontinued for you, your risk factors (such as having a new sexual partner) need to be reassessed  to determine if you should start having screenings again. Some women have medical problems that increase the chance of getting cervical cancer. In these cases, your health care provider may recommend that you have screening and Pap tests more often.  If you have a family history of uterine cancer or ovarian cancer, talk with your health care provider about genetic screening.  If you have vaginal bleeding after reaching menopause, tell your health care provider.  There are currently no reliable tests available to screen for ovarian cancer. Lung Cancer Lung cancer screening is recommended for adults 67-95 years old who are at high risk for lung cancer because of a history of smoking. A yearly low-dose CT scan of the lungs is recommended if you:  Currently smoke.  Have a history of at least 30 pack-years of smoking and you currently smoke or have quit within the past 15 years. A pack-year is smoking an average of one pack of cigarettes per day for one year. Yearly screening should:  Continue until it has been 15 years since you quit.  Stop if you develop a health problem that would prevent you from having lung cancer treatment. Colorectal Cancer  This type of cancer can be detected and can often be prevented.  Routine colorectal cancer screening usually begins at age 63 and continues through  age 75.  If you have risk factors for colon cancer, your health care provider may recommend that you be screened at an earlier age.  If you have a family history of colorectal cancer, talk with your health care provider about genetic screening.  Your health care provider may also recommend using home test kits to check for hidden blood in your stool.  A small camera at the end of a tube can be used to examine your colon directly (sigmoidoscopy or colonoscopy). This is done to check for the earliest forms of colorectal cancer.  Direct examination of the colon should be repeated every 5-10 years until  age 75. However, if early forms of precancerous polyps or small growths are found or if you have a family history or genetic risk for colorectal cancer, you may need to be screened more often. Skin Cancer  Check your skin from head to toe regularly.  Monitor any moles. Be sure to tell your health care provider: ? About any new moles or changes in moles, especially if there is a change in a mole's shape or color. ? If you have a mole that is larger than the size of a pencil eraser.  If any of your family members has a history of skin cancer, especially at a young age, talk with your health care provider about genetic screening.  Always use sunscreen. Apply sunscreen liberally and repeatedly throughout the day.  Whenever you are outside, protect yourself by wearing long sleeves, pants, a wide-brimmed hat, and sunglasses. What should I know about osteoporosis? Osteoporosis is a condition in which bone destruction happens more quickly than new bone creation. After menopause, you may be at an increased risk for osteoporosis. To help prevent osteoporosis or the bone fractures that can happen because of osteoporosis, the following is recommended:  If you are 19-50 years old, get at least 1,000 mg of calcium and at least 600 mg of vitamin D per day.  If you are older than age 50 but younger than age 70, get at least 1,200 mg of calcium and at least 600 mg of vitamin D per day.  If you are older than age 70, get at least 1,200 mg of calcium and at least 800 mg of vitamin D per day. Smoking and excessive alcohol intake increase the risk of osteoporosis. Eat foods that are rich in calcium and vitamin D, and do weight-bearing exercises several times each week as directed by your health care provider. What should I know about how menopause affects my mental health? Depression may occur at any age, but it is more common as you become older. Common symptoms of depression include:  Low or sad  mood.  Changes in sleep patterns.  Changes in appetite or eating patterns.  Feeling an overall lack of motivation or enjoyment of activities that you previously enjoyed.  Frequent crying spells. Talk with your health care provider if you think that you are experiencing depression. What should I know about immunizations? It is important that you get and maintain your immunizations. These include:  Tetanus, diphtheria, and pertussis (Tdap) booster vaccine.  Influenza every year before the flu season begins.  Pneumonia vaccine.  Shingles vaccine. Your health care provider may also recommend other immunizations. This information is not intended to replace advice given to you by your health care provider. Make sure you discuss any questions you have with your health care provider. Document Released: 07/07/2005 Document Revised: 12/03/2015 Document Reviewed: 02/16/2015 Elsevier Interactive Patient Education    2019 [place].

## 2018-07-07 NOTE — Assessment & Plan Note (Signed)
Xarelto- prior to travel only

## 2018-07-07 NOTE — Assessment & Plan Note (Addendum)
Xarelto prior to travel

## 2018-07-07 NOTE — Assessment & Plan Note (Signed)
CMET and lipid profile today Encouraged her to consume a low fat diet Continue Atorvastatin, will adjust if needed based on labs. 

## 2018-07-07 NOTE — Assessment & Plan Note (Signed)
Controlled on Atenolol and Amoldipine CBC and CMET today Reinforced DASH diet and exercise for weight loss

## 2018-07-07 NOTE — Assessment & Plan Note (Signed)
Continue Dexilant Discussed how weight loss and avoiding foods that trigger reflux can help improve symptoms CBC and CMET today

## 2018-07-07 NOTE — Assessment & Plan Note (Signed)
Continue Celebrex, Folic Acid and MTX She will continue to follow with rheumatology

## 2018-07-15 ENCOUNTER — Other Ambulatory Visit: Payer: Self-pay | Admitting: Internal Medicine

## 2018-07-18 ENCOUNTER — Other Ambulatory Visit: Payer: Self-pay | Admitting: Internal Medicine

## 2018-07-19 ENCOUNTER — Telehealth: Payer: Self-pay

## 2018-07-19 NOTE — Addendum Note (Signed)
Addended by: Roena Malady on: 07/19/2018 10:51 AM   Modules accepted: Orders

## 2018-07-19 NOTE — Telephone Encounter (Signed)
Left message for patient to call back to discuss 2 referrals that were placed for the patient

## 2018-07-26 NOTE — Telephone Encounter (Signed)
Left message for patient to call back  

## 2018-08-11 ENCOUNTER — Other Ambulatory Visit: Payer: Self-pay | Admitting: Internal Medicine

## 2018-08-13 ENCOUNTER — Other Ambulatory Visit: Payer: Self-pay | Admitting: Internal Medicine

## 2018-08-16 ENCOUNTER — Other Ambulatory Visit: Payer: Self-pay | Admitting: *Deleted

## 2018-08-16 DIAGNOSIS — M0579 Rheumatoid arthritis with rheumatoid factor of multiple sites without organ or systems involvement: Secondary | ICD-10-CM

## 2018-08-16 MED ORDER — METHOCARBAMOL 500 MG PO TABS: 500.0000 mg | ORAL_TABLET | Freq: Two times a day (BID) | ORAL | 1 refills | Status: DC

## 2018-08-16 NOTE — Telephone Encounter (Signed)
Patient left a voicemail stating that her pharmacy [facility] has sent several refill request over for her Robaxin and they told her they have not heard back from the office. Patient stated that she takes this for her RA and OA and she is now out of the medication completely. Patient stated that she needs this refilled and wants a call back stating that this has been taken care of. [facility] [facility] [address]

## 2018-12-11 ENCOUNTER — Other Ambulatory Visit: Payer: Self-pay | Admitting: Internal Medicine

## 2018-12-27 ENCOUNTER — Ambulatory Visit (INDEPENDENT_AMBULATORY_CARE_PROVIDER_SITE_OTHER): Payer: 59 | Admitting: Internal Medicine

## 2018-12-27 ENCOUNTER — Encounter: Payer: Self-pay | Admitting: Internal Medicine

## 2018-12-27 VITALS — Temp 95.9°F

## 2018-12-27 DIAGNOSIS — R519 Headache, unspecified: Secondary | ICD-10-CM

## 2018-12-27 DIAGNOSIS — R5383 Other fatigue: Secondary | ICD-10-CM

## 2018-12-27 DIAGNOSIS — R6883 Chills (without fever): Secondary | ICD-10-CM

## 2018-12-27 DIAGNOSIS — R0981 Nasal congestion: Secondary | ICD-10-CM | POA: Diagnosis not present

## 2018-12-27 DIAGNOSIS — R51 Headache: Secondary | ICD-10-CM

## 2018-12-27 DIAGNOSIS — R0982 Postnasal drip: Secondary | ICD-10-CM | POA: Diagnosis not present

## 2018-12-27 DIAGNOSIS — R52 Pain, unspecified: Secondary | ICD-10-CM

## 2018-12-27 NOTE — Patient Instructions (Signed)

## 2018-12-27 NOTE — Progress Notes (Signed)
Virtual Visit via Video Note  I connected with Angel Lee on 12/27/18 at  2:15 PM EDT by a video enabled telemedicine application and verified that I am speaking with the correct person using two identifiers.  Location: Patient: Home Provider: Office   I discussed the limitations of evaluation and management by telemedicine and the availability of in person appointments. The patient expressed understanding and agreed to proceed.  History of Present Illness:  Pt reports headaches, nasal congestion, itchy ears, and post nasal drip. This started 4-5 days ago.  The headache is located. She describes the pain as. She reports associated. She denies. She is not able to blow anything out of her nose. She denies ear pain or decreased hearing. She denies difficulty swallowing, cough or SOB. She does report chills and body aches, but denies fever. She has not tried any medications OTC. She has not had sick contacts that she is aware of.    Past Medical History:  Diagnosis Date  . Allergy   . Chicken pox   . DVT (deep venous thrombosis) (HCC) 2011  . GERD (gastroesophageal reflux disease)   . Hyperlipidemia   . Hypertension   . PE (pulmonary embolism) 2011  . Rheumatoid arthritis (HCC)     Current Outpatient Medications  Medication Sig Dispense Refill  . amLODipine (NORVASC) 5 MG tablet TAKE 1 TABLET BY MOUTH EVERY DAY 90 tablet 2  . atenolol (TENORMIN) 50 MG tablet Take 1 tablet (50 mg total) by mouth daily. 90 tablet 2  . atorvastatin (LIPITOR) 20 MG tablet TAKE 1 TABLET (20 MG TOTAL) BY MOUTH DAILY. 90 tablet 3  . celecoxib (CELEBREX) 200 MG capsule Take 1 capsule (200 mg total) by mouth daily. 90 capsule 2  . Dexlansoprazole (DEXILANT) 30 MG capsule Take 1 capsule (30 mg total) by mouth daily. 90 capsule 2  . folic acid (FOLVITE) 1 MG tablet TAKE 1 TABLET (1 MG TOTAL) BY MOUTH DAILY. 90 tablet 1  . methocarbamol (ROBAXIN) 500 MG tablet Take 1 tablet (500 mg total) by mouth 2 (two) times  daily. 180 tablet 1  . methotrexate 50 MG/2ML injection Inject 50 mg/m2 into the muscle once a week.   0  . XARELTO 20 MG TABS tablet Take 1 tablet (20 mg total) by mouth daily with supper. 90 tablet 1   No current facility-administered medications for this visit.     Allergies  Allergen Reactions  . Azithromycin Nausea Only  . Sulfa Antibiotics Hives  . Typhoid Vaccines Other (See Comments)    Pain, inflammation     Family History  Problem Relation Age of Onset  . Arthritis Mother   . Hyperlipidemia Mother   . Hyperlipidemia Father   . Heart disease Father   . Stroke Father   . Diabetes Father   . Hypertension Brother   . Prostate cancer Maternal Uncle   . Heart disease Paternal Aunt   . Stroke Paternal Aunt   . Stroke Paternal Uncle   . Hyperlipidemia Maternal Grandmother   . Hyperlipidemia Maternal Grandfather   . Heart disease Maternal Grandfather   . Stroke Maternal Grandfather   . Hypertension Maternal Grandfather   . Hyperlipidemia Paternal Grandmother   . Diabetes Paternal Grandmother   . Hyperlipidemia Paternal Grandfather     Social History   Socioeconomic History  . Marital status: Single    Spouse name: Not on file  . Number of children: Not on file  . Years of education: Not on file  .  Highest education level: Not on file  Occupational History  . Not on file  Social Needs  . Financial resource strain: Not on file  . Food insecurity    Worry: Not on file    Inability: Not on file  . Transportation needs    Medical: Not on file    Non-medical: Not on file  Tobacco Use  . Smoking status: Never Smoker  . Smokeless tobacco: Never Used  Substance and Sexual Activity  . Alcohol use: No  . Drug use: No  . Sexual activity: Not on file  Lifestyle  . Physical activity    Days per week: Not on file    Minutes per session: Not on file  . Stress: Not on file  Relationships  . Social Herbalist on phone: Not on file    Gets together: Not  on file    Attends religious service: Not on file    Active member of club or organization: Not on file    Attends meetings of clubs or organizations: Not on file    Relationship status: Not on file  . Intimate partner violence    Fear of current or ex partner: Not on file    Emotionally abused: Not on file    Physically abused: Not on file    Forced sexual activity: Not on file  Other Topics Concern  . Not on file  Social History Narrative  . Not on file     Constitutional: Pt reports headache ,fatigue, chills and body aches. Denies fever, malaise,  or abrupt weight changes.  HEENT: Pt reports nasal congestion, itchy ears, scratchy throat. Denies eye pain, eye redness, ear pain, ringing in the ears, wax buildup, runny nose,  bloody nose, or sore throat. Respiratory: Denies difficulty breathing, shortness of breath, cough or sputum production.   Cardiovascular: Denies chest pain, chest tightness, palpitations or swelling in the hands or feet.  Skin: Denies redness, rashes, lesions or ulcercations.  Neurological: Denies dizziness, difficulty with memory, difficulty with speech or problems with balance and coordination.    No other specific complaints in a complete review of systems (except as listed in HPI above).   Observations/Objective:  Temp (!) 95.9 F (35.5 C) (Oral)  Wt Readings from Last 3 Encounters:  07/05/18 253 lb (114.8 kg)  10/01/17 244 lb 8 oz (110.9 kg)  08/23/17 247 lb 8 oz (112.3 kg)    General: Appears her stated age, obese, in NAD. Skin: Warm, dry and intact. No rashes noted. HEENT: Head: normal shape and size;  Pulmonary/Chest: Normal effor. No respiratory distress.  Neurological: Alert and oriented.    BMET    Component Value Date/Time   NA 141 06/07/2017 1047   K 4.5 06/07/2017 1047   CL 104 06/07/2017 1047   CO2 30 06/07/2017 1047   GLUCOSE 94 06/07/2017 1047   BUN 10 06/07/2017 1047   CREATININE 0.84 06/07/2017 1047   CALCIUM 9.6  06/07/2017 1047   GFRNONAA >60 08/10/2016 0215   GFRAA >60 08/10/2016 0215    Lipid Panel     Component Value Date/Time   CHOL 206 (H) 06/07/2017 1047   TRIG 71.0 06/07/2017 1047   HDL 78.30 06/07/2017 1047   CHOLHDL 3 06/07/2017 1047   VLDL 14.2 06/07/2017 1047   LDLCALC 114 (H) 06/07/2017 1047    CBC    Component Value Date/Time   WBC 3.9 (L) 06/07/2017 1047   RBC 4.82 06/07/2017 1047   HGB 15.3 (  H) 06/07/2017 1047   HCT 46.7 (H) 06/07/2017 1047   PLT 240.0 06/07/2017 1047   MCV 97.0 06/07/2017 1047   MCH 30.6 08/10/2016 0215   MCHC 32.6 06/07/2017 1047   RDW 14.7 06/07/2017 1047   LYMPHSABS 2.1 08/10/2016 0215   MONOABS 0.4 08/10/2016 0215   EOSABS 0.1 08/10/2016 0215   BASOSABS 0.0 08/10/2016 0215    Hgb A1C Lab Results  Component Value Date   HGBA1C 5.9 05/17/2016        Assessment and Plan:  Headache, Fatigue, Nasal Congestion, Post Nasal Drip, Itchy Ears, Chills and Body Aches:  Allergies vs viral URI vs COVID 19 At this point, symptoms are improving Will hold off on COVID 19 testing at this time Discussed the importance of quarantine, masking and hand washing Advised her to get some rest and drink plenty of fluids Can use Claritin and Flonase OTC Tylenol as needed for headache  Return precautions discussed  Follow Up Instructions:    I discussed the assessment and treatment plan with the patient. The patient was provided an opportunity to ask questions and all were answered. The patient agreed with the plan and demonstrated an understanding of the instructions.   The patient was advised to call back or seek an in-person evaluation if the symptoms worsen or if the condition fails to improve as anticipated.     Nicki Reaper, NP

## 2018-12-31 ENCOUNTER — Ambulatory Visit: Payer: 59 | Admitting: Internal Medicine

## 2019-01-16 ENCOUNTER — Other Ambulatory Visit: Payer: Self-pay | Admitting: Internal Medicine

## 2019-01-16 DIAGNOSIS — M0579 Rheumatoid arthritis with rheumatoid factor of multiple sites without organ or systems involvement: Secondary | ICD-10-CM

## 2019-01-17 NOTE — Telephone Encounter (Signed)
Last filled 08/16/18 #180 with 1 refill, too soon

## 2019-02-03 ENCOUNTER — Other Ambulatory Visit: Payer: Self-pay | Admitting: Internal Medicine

## 2019-02-03 DIAGNOSIS — M0579 Rheumatoid arthritis with rheumatoid factor of multiple sites without organ or systems involvement: Secondary | ICD-10-CM

## 2019-02-04 NOTE — Telephone Encounter (Signed)
Last filled 07/2018 #180 with 1 refill... please advise

## 2019-02-12 ENCOUNTER — Other Ambulatory Visit: Payer: Self-pay | Admitting: Internal Medicine

## 2019-04-11 ENCOUNTER — Other Ambulatory Visit: Payer: Self-pay | Admitting: Internal Medicine

## 2019-04-11 DIAGNOSIS — Z1231 Encounter for screening mammogram for malignant neoplasm of breast: Secondary | ICD-10-CM

## 2019-04-15 ENCOUNTER — Telehealth: Payer: Self-pay

## 2019-04-15 NOTE — Telephone Encounter (Signed)
Pt left v/m to see if pt is up to date on getting flu shot. Last annual on 07/05/18 when pt got last flu shot. In v/m pt wants to verify that Avie Echevaria NP is recommending pt to take the flu shot. Pt request cb.

## 2019-04-15 NOTE — Telephone Encounter (Signed)
It is recommended pt has flu shot and Left detailed msg on VM per HIPAA letting pt know

## 2019-04-26 ENCOUNTER — Other Ambulatory Visit: Payer: Self-pay | Admitting: Internal Medicine

## 2019-05-15 ENCOUNTER — Other Ambulatory Visit: Payer: Self-pay | Admitting: Internal Medicine

## 2019-06-02 ENCOUNTER — Ambulatory Visit: Payer: 59 | Admitting: Internal Medicine

## 2019-06-02 ENCOUNTER — Ambulatory Visit: Payer: Self-pay

## 2019-06-02 NOTE — Telephone Encounter (Signed)
Symptoms started 05/22/19- dizziness lightheadedness. Took dramamine which helped Right ear, pain full. Slightly dizziness, mild headache.  When pt turns head to right has vertigo. Left side worsens  vertigo.  Pt denies any weakness or numbness to the face, arms or legs, visual disturbances, or numbness or tingling. Pt stated she has been treated for these sx before and was dx with ear infection and sinus infection. Care advice given, pt verbalized understanding, call transferred back to [facility] to get appt. Reason for Disposition . Earache  Answer Assessment - Initial Assessment Questions 1. DESCRIPTION: "Describe your dizziness."     vertigo 2. VERTIGO: "Do you feel like either you or the room is spinning or tilting?"      yes 3. LIGHTHEADED: "Do you feel lightheaded?" (e.g., somewhat faint, woozy, weak upon standing)     This am  4. SEVERITY: "How bad is it?"  "Can you walk?"   - MILD - Feels unsteady but walking normally.   - MODERATE - Feels very unsteady when walking, but not falling; interferes with normal activities (e.g., school, work) .   - SEVERE - Unable to walk without falling (requires assistance).    Mild gets off balance with turning around 5. ONSET:  "When did the dizziness begin?"     05/22/19 6. AGGRAVATING FACTORS: "Does anything make it worse?" (e.g., standing, change in head position)     Change in head position 7. CAUSE: "What do you think is causing the dizziness?"     Ear pain 8. RECURRENT SYMPTOM: "Have you had dizziness before?" If so, ask: "When was the last time?" "What happened that time?"     Yes- years ago- ear infection and sinus infection 9. OTHER SYMPTOMS: "Do you have any other symptoms?" (e.g., headache, weakness, numbness, vomiting, earache)     Headache (mild), earache to right ear, fatigue 10. PREGNANCY: "Is there any chance you are pregnant?" "When was your last menstrual period?"       n/a  Protocols used: DIZZINESS - VERTIGO-A-AH

## 2019-06-03 ENCOUNTER — Other Ambulatory Visit: Payer: Self-pay

## 2019-06-03 ENCOUNTER — Ambulatory Visit: Payer: 59 | Admitting: Internal Medicine

## 2019-06-03 ENCOUNTER — Encounter: Payer: Self-pay | Admitting: Internal Medicine

## 2019-06-03 VITALS — BP 126/84 | HR 64 | Temp 97.6°F | Wt 256.0 lb

## 2019-06-03 DIAGNOSIS — H8111 Benign paroxysmal vertigo, right ear: Secondary | ICD-10-CM

## 2019-06-03 DIAGNOSIS — H6123 Impacted cerumen, bilateral: Secondary | ICD-10-CM

## 2019-06-03 DIAGNOSIS — G44201 Tension-type headache, unspecified, intractable: Secondary | ICD-10-CM

## 2019-06-03 NOTE — Patient Instructions (Signed)
Benign Positional Vertigo Vertigo is the feeling that you or your surroundings are moving when they are not. Benign positional vertigo is the most common form of vertigo. This is usually a harmless condition (benign). This condition is positional. This means that symptoms are triggered by certain movements and positions. This condition can be dangerous if it occurs while you are doing something that could cause harm to you or others. This includes activities such as driving or operating machinery. What are the causes? In many cases, the cause of this condition is not known. It may be caused by a disturbance in an area of the inner ear that helps your brain to sense movement and balance. This disturbance can be caused by:  Viral infection (labyrinthitis).  Head injury.  Repetitive motion, such as jumping, dancing, or running. What increases the risk? You are more likely to develop this condition if:  You are a woman.  You are 50 years of age or older. What are the signs or symptoms? Symptoms of this condition usually happen when you move your head or your eyes in different directions. Symptoms may start suddenly, and usually last for less than a minute. They include:  Loss of balance and falling.  Feeling like you are spinning or moving.  Feeling like your surroundings are spinning or moving.  Nausea and vomiting.  Blurred vision.  Dizziness.  Involuntary eye movement (nystagmus). Symptoms can be mild and cause only minor problems, or they can be severe and interfere with daily life. Episodes of benign positional vertigo may return (recur) over time. Symptoms may improve over time. How is this diagnosed? This condition may be diagnosed based on:  Your medical history.  Physical exam of the head, neck, and ears.  Tests, such as: ? MRI. ? CT scan. ? Eye movement tests. Your health care provider may ask you to change positions quickly while he or she watches you for symptoms  of benign positional vertigo, such as nystagmus. Eye movement may be tested with a variety of exams that are designed to evaluate or stimulate vertigo. ? An electroencephalogram (EEG). This records electrical activity in your brain. ? Hearing tests. You may be referred to a health care provider who specializes in ear, nose, and throat (ENT) problems (otolaryngologist) or a provider who specializes in disorders of the nervous system (neurologist). How is this treated?  This condition may be treated in a session in which your health care provider moves your head in specific positions to adjust your inner ear back to normal. Treatment for this condition may take several sessions. Surgery may be needed in severe cases, but this is rare. In some cases, benign positional vertigo may resolve on its own in 2-4 weeks. Follow these instructions at home: Safety  Move slowly. Avoid sudden body or head movements or certain positions, as told by your health care provider.  Avoid driving until your health care provider says it is safe for you to do so.  Avoid operating heavy machinery until your health care provider says it is safe for you to do so.  Avoid doing any tasks that would be dangerous to you or others if vertigo occurs.  If you have trouble walking or keeping your balance, try using a cane for stability. If you feel dizzy or unstable, sit down right away.  Return to your normal activities as told by your health care provider. Ask your health care provider what activities are safe for you. General instructions  Take over-the-counter   and prescription medicines only as told by your health care provider.  Drink enough fluid to keep your urine pale yellow.  Keep all follow-up visits as told by your health care provider. This is important. Contact a health care provider if:  You have a fever.  Your condition gets worse or you develop new symptoms.  Your family or friends notice any  behavioral changes.  You have nausea or vomiting that gets worse.  You have numbness or a "pins and needles" sensation. Get help right away if you:  Have difficulty speaking or moving.  Are always dizzy.  Faint.  Develop severe headaches.  Have weakness in your legs or arms.  Have changes in your hearing or vision.  Develop a stiff neck.  Develop sensitivity to light. Summary  Vertigo is the feeling that you or your surroundings are moving when they are not. Benign positional vertigo is the most common form of vertigo.  The cause of this condition is not known. It may be caused by a disturbance in an area of the inner ear that helps your brain to sense movement and balance.  Symptoms include loss of balance and falling, feeling that you or your surroundings are moving, nausea and vomiting, and blurred vision.  This condition can be diagnosed based on symptoms, physical exam, and other tests, such as MRI, CT scan, eye movement tests, and hearing tests.  Follow safety instructions as told by your health care provider. You will also be told when to contact your health care provider in case of problems. This information is not intended to replace advice given to you by your health care provider. Make sure you discuss any questions you have with your health care provider. Document Revised: 10/24/2017 Document Reviewed: 10/24/2017 Elsevier Patient Education  2020 [employer] Inc[employer]  

## 2019-06-03 NOTE — Progress Notes (Signed)
Subjective:    Patient ID: Angel Lee, female    DOB: 24-Sep-1960, 59 y.o.   MRN: [id number]  HPI  Pt presents to the clinic today with c/o headaches and dizziness. The headache started a few days ago. She describes the pain as sore and achy. The pain does not radiate. She has some dizziness but thinks this may be a separate issue. She denies visual changes, sensitivity to light and sound, nausea or vomiting. She denies neck pain. She took Tylenol with full resolution of her headache.  She describes the dizziness as a sense that the room is spinning and lightheaded. This started 1-2 weeks ago. She reports the dizziness is worse when she turns her head to the right or turns over to the right in bed. She reports associated ear fullness but denies ear pain or loss of hearing. She has tried Dramamine OTC with some relief.  Review of Systems  Past Medical History:  Diagnosis Date  . Allergy   . Chicken pox   . DVT (deep venous thrombosis) ([place]) 2011  . GERD (gastroesophageal reflux disease)   . Hyperlipidemia   . Hypertension   . PE (pulmonary embolism) 2011  . Rheumatoid arthritis ([place])     Current Outpatient Medications  Medication Sig Dispense Refill  . amLODipine (NORVASC) 5 MG tablet TAKE 1 TABLET BY MOUTH EVERY DAY 90 tablet 2  . atenolol (TENORMIN) 50 MG tablet TAKE 1 TABLET BY MOUTH EVERY DAY 90 tablet 0  . atorvastatin (LIPITOR) 20 MG tablet TAKE 1 TABLET (20 MG TOTAL) BY MOUTH DAILY. 90 tablet 3  . celecoxib (CELEBREX) 200 MG capsule Take 1 capsule (200 mg total) by mouth daily. 90 capsule 2  . Dexlansoprazole (DEXILANT) 30 MG capsule Take 1 capsule (30 mg total) by mouth daily. SCHEDULE PHYSICAL Feb 2021 90 capsule 0  . folic acid (FOLVITE) 1 MG tablet TAKE 1 TABLET (1 MG TOTAL) BY MOUTH DAILY. 90 tablet 1  . methocarbamol (ROBAXIN) 500 MG tablet TAKE 1 TABLET BY MOUTH TWICE A DAY 180 tablet 1  . methotrexate 50 MG/2ML injection Inject 50 mg/m2 into the muscle once a week.    0  . XARELTO 20 MG TABS tablet Take 1 tablet (20 mg total) by mouth daily with supper. 90 tablet 1   No current facility-administered medications for this visit.    Allergies  Allergen Reactions  . Azithromycin Nausea Only  . Sulfa Antibiotics Hives  . Typhoid Vaccines Other (See Comments)    Pain, inflammation     Family History  Problem Relation Age of Onset  . Arthritis Mother   . Hyperlipidemia Mother   . Hyperlipidemia Father   . Heart disease Father   . Stroke Father   . Diabetes Father   . Hypertension Brother   . Prostate cancer Maternal Uncle   . Heart disease Paternal Aunt   . Stroke Paternal Aunt   . Stroke Paternal Uncle   . Hyperlipidemia Maternal Grandmother   . Hyperlipidemia Maternal Grandfather   . Heart disease Maternal Grandfather   . Stroke Maternal Grandfather   . Hypertension Maternal Grandfather   . Hyperlipidemia Paternal Grandmother   . Diabetes Paternal Grandmother   . Hyperlipidemia Paternal Grandfather     Social History   Socioeconomic History  . Marital status: Single    Spouse name: Not on file  . Number of children: Not on file  . Years of education: Not on file  . Highest education level: Not on  file  Occupational History  . Not on file  Tobacco Use  . Smoking status: Never Smoker  . Smokeless tobacco: Never Used  Substance and Sexual Activity  . Alcohol use: No  . Drug use: No  . Sexual activity: Not on file  Other Topics Concern  . Not on file  Social History Narrative  . Not on file   Social Determinants of Health   Financial Resource Strain:   . Difficulty of Paying Living Expenses: Not on file  Food Insecurity:   . Worried About Programme researcher, broadcasting/film/video in the Last Year: Not on file  . Ran Out of Food in the Last Year: Not on file  Transportation Needs:   . Lack of Transportation (Medical): Not on file  . Lack of Transportation (Non-Medical): Not on file  Physical Activity:   . Days of Exercise per Week: Not on  file  . Minutes of Exercise per Session: Not on file  Stress:   . Feeling of Stress : Not on file  Social Connections:   . Frequency of Communication with Friends and Family: Not on file  . Frequency of Social Gatherings with Friends and Family: Not on file  . Attends Religious Services: Not on file  . Active Member of Clubs or Organizations: Not on file  . Attends Banker Meetings: Not on file  . Marital Status: Not on file  Intimate Partner Violence:   . Fear of Current or Ex-Partner: Not on file  . Emotionally Abused: Not on file  . Physically Abused: Not on file  . Sexually Abused: Not on file     Constitutional: Pt reports headaches. Denies fever, malaise, fatigue, or abrupt weight changes.  HEENT: Pt reports ear fullness. Denies eye pain, eye redness, ear pain, ringing in the ears, wax buildup, runny nose, nasal congestion, bloody nose, or sore throat. Respiratory: Denies difficulty breathing, shortness of breath, cough or sputum production.   Cardiovascular: Denies chest pain, chest tightness, palpitations or swelling in the hands or feet.  Musculoskeletal: Denies decrease in range of motion, difficulty with gait, muscle pain or joint pain and swelling.  Neurological: Pt reports dizziness. Denies difficulty with memory, difficulty with speech or problems with balance and coordination.    No other specific complaints in a complete review of systems (except as listed in HPI above).     Objective:   Physical Exam   BP 126/84   Pulse 64   Temp 97.6 F (36.4 C) (Temporal)   Wt 256 lb (116.1 kg)   SpO2 98%   BMI 46.45 kg/m  Wt Readings from Last 3 Encounters:  06/03/19 256 lb (116.1 kg)  07/05/18 253 lb (114.8 kg)  10/01/17 244 lb 8 oz (110.9 kg)    General: Appears her stated age, obese, in NAD. HEENT: Head: normal shape and size; Eyes: no nystagmus noted; Ears: Bilateral cerumen impaction;  Cardiovascular: Normal rate and rhythm. S1,S2 noted.  No  murmur, rubs or gallops noted. Pulmonary/Chest: Normal effort and positive vesicular breath sounds. No respiratory distress. No wheezes, rales or ronchi noted.  Musculoskeletal: Normal flexion, extension and rotation of the cervical spine. No bony tenderness noted over the cervical spine.  No difficulty with gait.  Neurological: Alert and oriented. Coordination normal.    BMET    Component Value Date/Time   NA 141 06/07/2017 1047   K 4.5 06/07/2017 1047   CL 104 06/07/2017 1047   CO2 30 06/07/2017 1047   GLUCOSE 94 06/07/2017  1047   BUN 10 06/07/2017 1047   CREATININE 0.84 06/07/2017 1047   CALCIUM 9.6 06/07/2017 1047   GFRNONAA >60 08/10/2016 0215   GFRAA >60 08/10/2016 0215    Lipid Panel     Component Value Date/Time   CHOL 206 (H) 06/07/2017 1047   TRIG 71.0 06/07/2017 1047   HDL 78.30 06/07/2017 1047   CHOLHDL 3 06/07/2017 1047   VLDL 14.2 06/07/2017 1047   LDLCALC 114 (H) 06/07/2017 1047    CBC    Component Value Date/Time   WBC 3.9 (L) 06/07/2017 1047   RBC 4.82 06/07/2017 1047   HGB 15.3 (H) 06/07/2017 1047   HCT 46.7 (H) 06/07/2017 1047   PLT 240.0 06/07/2017 1047   MCV 97.0 06/07/2017 1047   MCH 30.6 08/10/2016 0215   MCHC 32.6 06/07/2017 1047   RDW 14.7 06/07/2017 1047   LYMPHSABS 2.1 08/10/2016 0215   MONOABS 0.4 08/10/2016 0215   EOSABS 0.1 08/10/2016 0215   BASOSABS 0.0 08/10/2016 0215    Hgb A1C Lab Results  Component Value Date   HGBA1C 5.9 05/17/2016          Assessment & Plan:   Bilateral Cerumen Impaction:  Manual lave by CMA Can try Debrox OTC 2 x week to prevent waxbulidup  BPPV, Right:  Can try Epley Maneuver at home Continue Dramamine prn  Acute Headache:  Exam benign Symptoms resolved Will monitor  Return precautions discussed Nicki Reaper, NP This visit occurred during the SARS-CoV-2 public health emergency.  Safety protocols were in place, including screening questions prior to the visit, additional usage of  staff PPE, and extensive cleaning of exam room while observing appropriate contact time as indicated for disinfecting solutions.

## 2019-06-05 ENCOUNTER — Ambulatory Visit
Admission: RE | Admit: 2019-06-05 | Discharge: 2019-06-05 | Disposition: A | Discharge status: Home / Self Care | Payer: 59 | Source: Ambulatory Visit | Attending: Internal Medicine | Admitting: Internal Medicine

## 2019-06-05 ENCOUNTER — Other Ambulatory Visit: Payer: Self-pay

## 2019-06-05 ENCOUNTER — Ambulatory Visit: Payer: 59

## 2019-06-05 DIAGNOSIS — Z1231 Encounter for screening mammogram for malignant neoplasm of breast: Secondary | ICD-10-CM

## 2019-06-14 ENCOUNTER — Other Ambulatory Visit: Payer: Self-pay | Admitting: Internal Medicine

## 2019-07-26 ENCOUNTER — Other Ambulatory Visit: Payer: Self-pay | Admitting: Internal Medicine

## 2019-08-04 ENCOUNTER — Other Ambulatory Visit: Payer: Self-pay | Admitting: Internal Medicine

## 2019-08-25 ENCOUNTER — Other Ambulatory Visit: Payer: Self-pay | Admitting: Internal Medicine

## 2019-09-18 ENCOUNTER — Other Ambulatory Visit: Payer: Self-pay | Admitting: Internal Medicine

## 2019-10-31 ENCOUNTER — Other Ambulatory Visit: Payer: Self-pay | Admitting: Internal Medicine

## 2019-12-05 ENCOUNTER — Other Ambulatory Visit: Payer: Self-pay

## 2019-12-05 ENCOUNTER — Encounter: Payer: 59 | Admitting: Internal Medicine

## 2019-12-05 MED ORDER — AMLODIPINE BESYLATE 5 MG PO TABS: 5.0000 mg | ORAL_TABLET | Freq: Every day | ORAL | 0 refills | Status: DC

## 2019-12-05 MED ORDER — DEXILANT 30 MG PO CPDR: 30.0000 mg | DELAYED_RELEASE_CAPSULE | Freq: Every day | ORAL | 0 refills | Status: DC

## 2019-12-05 NOTE — Telephone Encounter (Signed)
Patient contacted the office. She states she was scheduled for a CPE today, but it had to be rescheduled to 01/23/20. She states she is due for a refill of Dexilant, Altenolol, as well as Amlodipine - and needs these refilled prior to her CPE appt.   Dexilant was last refilled 08/26/19 for #90 with 0 refills.  Atenolol was last refilled on 10/31/19 for #90 with 0 refills.  Amlodipine was last refilled 08/05/19 for #90 with 0 refills.   She would like these sent to [facility] on [address].

## 2020-01-20 ENCOUNTER — Encounter: Payer: 59 | Admitting: Internal Medicine

## 2020-01-23 ENCOUNTER — Encounter: Payer: 59 | Admitting: Internal Medicine

## 2020-01-23 ENCOUNTER — Other Ambulatory Visit: Payer: Self-pay | Admitting: Internal Medicine

## 2020-02-18 ENCOUNTER — Ambulatory Visit (INDEPENDENT_AMBULATORY_CARE_PROVIDER_SITE_OTHER): Payer: 59 | Admitting: Internal Medicine

## 2020-02-18 ENCOUNTER — Other Ambulatory Visit: Payer: Self-pay

## 2020-02-18 ENCOUNTER — Encounter: Payer: Self-pay | Admitting: Internal Medicine

## 2020-02-18 VITALS — BP 124/84 | HR 64 | Temp 97.1°F | Ht 62.25 in | Wt 254.0 lb

## 2020-02-18 DIAGNOSIS — Z86711 Personal history of pulmonary embolism: Secondary | ICD-10-CM | POA: Diagnosis not present

## 2020-02-18 DIAGNOSIS — M8949 Other hypertrophic osteoarthropathy, multiple sites: Secondary | ICD-10-CM

## 2020-02-18 DIAGNOSIS — M0579 Rheumatoid arthritis with rheumatoid factor of multiple sites without organ or systems involvement: Secondary | ICD-10-CM

## 2020-02-18 DIAGNOSIS — Z86718 Personal history of other venous thrombosis and embolism: Secondary | ICD-10-CM | POA: Diagnosis not present

## 2020-02-18 DIAGNOSIS — M159 Polyosteoarthritis, unspecified: Secondary | ICD-10-CM

## 2020-02-18 DIAGNOSIS — Z0000 Encounter for general adult medical examination without abnormal findings: Secondary | ICD-10-CM | POA: Diagnosis not present

## 2020-02-18 DIAGNOSIS — E7800 Pure hypercholesterolemia, unspecified: Secondary | ICD-10-CM | POA: Diagnosis not present

## 2020-02-18 DIAGNOSIS — Z23 Encounter for immunization: Secondary | ICD-10-CM | POA: Diagnosis not present

## 2020-02-18 DIAGNOSIS — M1990 Unspecified osteoarthritis, unspecified site: Secondary | ICD-10-CM | POA: Insufficient documentation

## 2020-02-18 DIAGNOSIS — M150 Primary generalized (osteo)arthritis: Secondary | ICD-10-CM

## 2020-02-18 DIAGNOSIS — K219 Gastro-esophageal reflux disease without esophagitis: Secondary | ICD-10-CM

## 2020-02-18 DIAGNOSIS — I10 Essential (primary) hypertension: Secondary | ICD-10-CM

## 2020-02-18 NOTE — Progress Notes (Signed)
Subjective:    Patient ID: Angel Lee, female    DOB: December 07, 1960, 59 y.o.   MRN: [id number]  HPI  Pt presents to the clinic today for her annual exam. She is also due to follow up chronic conditions.  RA: Managed on Humira, Celebrex, Folic Acid, Methocarbamol and MTX. She follows with [facility].  GERD: Triggered by caffeine which she avoids. She denies breakthrough on Dexlansoprazole. Upper GI from 10/2008 reviewed.  HTN: Her BP today is 124/84. She is taking Atenolol and Amlodipine as prescribed. There is no ECG on file.  HLD: Her last LDL was 114, 05/2017. She denies myalgias on Atorvastatin. She tries to consume a low fat diet.  History of DVT/PE: Will remain on lifelong anticoagulation, currently on Xarelto. She denies s/s of bleeding . She continues to have pain in her her right posterior knee. She would like a repeat ultrasound today.  OA: Mainly in her knees and shoulders. Controlled with Celebrex. Plans to have TKR, right around 06/2020.   Flu: 06/2018 Tetanus: 2012 Covid: Pfizer Pap Smear: 2020, [facility] Mammogram: 05/2019 Bone Density: about 4 years ago Colon Screening: 2014, every 10 years Vision Screening: annually Dentist: annually  Diet: She does eat meat. She consumes fruits and veggies daily. She tries to avoid fried foods. She drinks mostly coffee and water, some soda, tea and lemonade. Exercise: Stationary bike  Review of Systems  Past Medical History:  Diagnosis Date  . Allergy   . Chicken pox   . DVT (deep venous thrombosis) ([facility]) 2011  . GERD (gastroesophageal reflux disease)   . Hyperlipidemia   . Hypertension   . PE (pulmonary embolism) 2011  . Rheumatoid arthritis ([facility])     Current Outpatient Medications  Medication Sig Dispense Refill  . amLODipine (NORVASC) 5 MG tablet Take 1 tablet (5 mg total) by mouth daily. 90 tablet 0  . amLODipine (NORVASC) 5 MG tablet Take 1 tablet (5 mg total) by mouth daily. 90 tablet 0  .  atenolol (TENORMIN) 50 MG tablet Take 1 tablet (50 mg total) by mouth daily. 90 tablet 0  . atorvastatin (LIPITOR) 20 MG tablet Take 1 tablet (20 mg total) by mouth daily. MUST SCHEDULE PHYSICAL EXAM 90 tablet 3  . celecoxib (CELEBREX) 200 MG capsule Take 1 capsule (200 mg total) by mouth daily. 90 capsule 2  . Dexlansoprazole (DEXILANT) 30 MG capsule Take 1 capsule (30 mg total) by mouth daily. 90 capsule 0  . folic acid (FOLVITE) 1 MG tablet TAKE 1 TABLET (1 MG TOTAL) BY MOUTH DAILY. 90 tablet 1  . methocarbamol (ROBAXIN) 500 MG tablet TAKE 1 TABLET BY MOUTH TWICE A DAY 180 tablet 1  . methotrexate 50 MG/2ML injection Inject 50 mg/m2 into the muscle once a week.   0  . XARELTO 20 MG TABS tablet Take 1 tablet (20 mg total) by mouth daily with supper. (Patient taking differently: Take 20 mg by mouth. 2 hours before travel) 90 tablet 1   No current facility-administered medications for this visit.    Allergies  Allergen Reactions  . Erythromycin Nausea And Vomiting  . Sulfa Antibiotics Hives  . Typhoid Vaccines Other (See Comments)    Pain, inflammation     Family History  Problem Relation Age of Onset  . Arthritis Mother   . Hyperlipidemia Mother   . Hyperlipidemia Father   . Heart disease Father   . Stroke Father   . Diabetes Father   . Hypertension Brother   .  Prostate cancer Maternal Uncle   . Heart disease Paternal Aunt   . Stroke Paternal Aunt   . Stroke Paternal Uncle   . Hyperlipidemia Maternal Grandmother   . Hyperlipidemia Maternal Grandfather   . Heart disease Maternal Grandfather   . Stroke Maternal Grandfather   . Hypertension Maternal Grandfather   . Hyperlipidemia Paternal Grandmother   . Diabetes Paternal Grandmother   . Hyperlipidemia Paternal Grandfather     Social History   Socioeconomic History  . Marital status: Single    Spouse name: Not on file  . Number of children: Not on file  . Years of education: Not on file  . Highest education level: Not  on file  Occupational History  . Not on file  Tobacco Use  . Smoking status: Never Smoker  . Smokeless tobacco: Never Used  Substance and Sexual Activity  . Alcohol use: No  . Drug use: No  . Sexual activity: Not on file  Other Topics Concern  . Not on file  Social History Narrative  . Not on file   Social Determinants of Health   Financial Resource Strain:   . Difficulty of Paying Living Expenses: Not on file  Food Insecurity:   . Worried About Programme researcher, broadcasting/film/video in the Last Year: Not on file  . Ran Out of Food in the Last Year: Not on file  Transportation Needs:   . Lack of Transportation (Medical): Not on file  . Lack of Transportation (Non-Medical): Not on file  Physical Activity:   . Days of Exercise per Week: Not on file  . Minutes of Exercise per Session: Not on file  Stress:   . Feeling of Stress : Not on file  Social Connections:   . Frequency of Communication with Friends and Family: Not on file  . Frequency of Social Gatherings with Friends and Family: Not on file  . Attends Religious Services: Not on file  . Active Member of Clubs or Organizations: Not on file  . Attends Banker Meetings: Not on file  . Marital Status: Not on file  Intimate Partner Violence:   . Fear of Current or Ex-Partner: Not on file  . Emotionally Abused: Not on file  . Physically Abused: Not on file  . Sexually Abused: Not on file     Constitutional: Denies fever, malaise, fatigue, headache or abrupt weight changes.  HEENT: Denies eye pain, eye redness, ear pain, ringing in the ears, wax buildup, runny nose, nasal congestion, bloody nose, or sore throat. Respiratory: Denies difficulty breathing, shortness of breath, cough or sputum production.   Cardiovascular: Denies chest pain, chest tightness, palpitations or swelling in the hands or feet.  Gastrointestinal: Denies abdominal pain, bloating, constipation, diarrhea or blood in the stool.  GU: Denies urgency,  frequency, pain with urination, burning sensation, blood in urine, odor or discharge. Musculoskeletal: Pt reports joint pain, pain in right posterior knee. Denies decrease in range of motion, difficulty with gait, muscle pain or joint welling.  Skin: Denies redness, rashes, lesions or ulcercations.  Neurological: Denies dizziness, difficulty with memory, difficulty with speech or problems with balance and coordination.  Psych: Denies anxiety, depression, SI/HI.  No other specific complaints in a complete review of systems (except as listed in HPI above).     Objective:   Physical Exam   BP 124/84   Pulse 64   Temp (!) 97.1 F (36.2 C) (Temporal)   Ht 5' 2.25" (1.581 m)   Wt 254 lb (  115.2 kg)   SpO2 98%   BMI 46.08 kg/m   Wt Readings from Last 3 Encounters:  06/03/19 256 lb (116.1 kg)  07/05/18 253 lb (114.8 kg)  10/01/17 244 lb 8 oz (110.9 kg)    General: Appears her stated age, obese, in NAD. Skin: Warm, dry and intact. No rashesnoted. HEENT: Head: normal shape and size; Eyes: sclera white, no icterus, conjunctiva pink, PERRLA and EOMs intact; Neck:  Neck supple, trachea midline. No masses, lumps or thyromegaly present.  Cardiovascular: Normal rate and rhythm. S1,S2 noted.  No murmur, rubs or gallops noted. No JVD or BLE edema. No carotid bruits noted. Pulmonary/Chest: Normal effort and positive vesicular breath sounds. No respiratory distress. No wheezes, rales or ronchi noted.  Abdomen: Soft and nontender. Normal bowel sounds. No distention or masses noted. Liver, spleen and kidneys non palpable. Musculoskeletal: Strength 5/5 BUE/BLE. No difficulty with gait.  Neurological: Alert and oriented. Cranial nerves II-XII grossly intact. Coordination normal.  Psychiatric: Mood and affect normal. Behavior is normal. Judgment and thought content normal.     BMET    Component Value Date/Time   NA 141 06/07/2017 1047   K 4.5 06/07/2017 1047   CL 104 06/07/2017 1047   CO2 30  06/07/2017 1047   GLUCOSE 94 06/07/2017 1047   BUN 10 06/07/2017 1047   CREATININE 0.84 06/07/2017 1047   CALCIUM 9.6 06/07/2017 1047   GFRNONAA >60 08/10/2016 0215   GFRAA >60 08/10/2016 0215    Lipid Panel     Component Value Date/Time   CHOL 206 (H) 06/07/2017 1047   TRIG 71.0 06/07/2017 1047   HDL 78.30 06/07/2017 1047   CHOLHDL 3 06/07/2017 1047   VLDL 14.2 06/07/2017 1047   LDLCALC 114 (H) 06/07/2017 1047    CBC    Component Value Date/Time   WBC 3.9 (L) 06/07/2017 1047   RBC 4.82 06/07/2017 1047   HGB 15.3 (H) 06/07/2017 1047   HCT 46.7 (H) 06/07/2017 1047   PLT 240.0 06/07/2017 1047   MCV 97.0 06/07/2017 1047   MCH 30.6 08/10/2016 0215   MCHC 32.6 06/07/2017 1047   RDW 14.7 06/07/2017 1047   LYMPHSABS 2.1 08/10/2016 0215   MONOABS 0.4 08/10/2016 0215   EOSABS 0.1 08/10/2016 0215   BASOSABS 0.0 08/10/2016 0215    Hgb A1C Lab Results  Component Value Date   HGBA1C 5.9 05/17/2016           Assessment & Plan:   Preventative Health Maintenance:  Flu shot today Tetanus UTD Covid UTD Pap smear UTD Mammogram UTD Bone density ordered, she will schedule with her mammogram 05/2020 Colon screening UTD Encouraged her to consume a balanced diet and exercise regimen Advised her to see an eye doctor and dentist annually Will check CBC, CMET, Lipid, A1C and Vit D today  RTC in 1 year, sooner if needed Nicki Reaper, NP This visit occurred during the SARS-CoV-2 public health emergency.  Safety protocols were in place, including screening questions prior to the visit, additional usage of staff PPE, and extensive cleaning of exam room while observing appropriate contact time as indicated for disinfecting solutions.

## 2020-02-18 NOTE — Patient Instructions (Signed)

## 2020-02-18 NOTE — Assessment & Plan Note (Signed)
Aviod triggers, weight loss will help Continue Dexlansoprazole CBC and CMET today

## 2020-02-18 NOTE — Assessment & Plan Note (Signed)
Continue Xarelto CBC today Will obtain venous doppler of RLE given her ongoing pain

## 2020-02-18 NOTE — Assessment & Plan Note (Signed)
Continue Celebrex Plans to have TKR, right early next year Encouraged weight loss

## 2020-02-18 NOTE — Assessment & Plan Note (Signed)
CMET and Lipid profile today Encouraged her to consume a low fat diet Continue Atorvastatin 

## 2020-02-18 NOTE — Assessment & Plan Note (Signed)
Controlled on Amlodipine and Atenolol Reinforced DASH diet and exercise for weight loss CMET today

## 2020-02-18 NOTE — Assessment & Plan Note (Signed)
Continue Humira, Celebrex, Folic Acid, Methocarbamol and MTX CMET, CBC today She will continue to follow with rheumatology

## 2020-02-18 NOTE — Assessment & Plan Note (Signed)
Continue Xarelto CBC today 

## 2020-02-19 LAB — COMPREHENSIVE METABOLIC PANEL
ALT: 16 U/L (ref 0–35)
AST: 19 U/L (ref 0–37)
Albumin: 4.1 g/dL (ref 3.5–5.2)
Alkaline Phosphatase: 61 U/L (ref 39–117)
BUN: 11 mg/dL (ref 6–23)
CO2: 25 mEq/L (ref 19–32)
Calcium: 9.3 mg/dL (ref 8.4–10.5)
Chloride: 103 mEq/L (ref 96–112)
Creatinine, Ser: 0.8 mg/dL (ref 0.40–1.20)
GFR: 88.9 mL/min (ref >60.00)
Glucose, Bld: 80 mg/dL (ref 70–99)
Potassium: 3.9 mEq/L (ref 3.5–5.1)
Sodium: 138 mEq/L (ref 135–145)
Total Bilirubin: 0.5 mg/dL (ref 0.2–1.2)
Total Protein: 7.6 g/dL (ref 6.0–8.3)

## 2020-02-19 LAB — CBC
HCT: 44.5 % (ref 36.0–46.0)
Hemoglobin: 14.5 g/dL (ref 12.0–15.0)
MCHC: 32.6 g/dL (ref 30.0–36.0)
MCV: 93.8 fl (ref 78.0–100.0)
Platelets: 222 10*3/uL (ref 150.0–400.0)
RBC: 4.75 Mil/uL (ref 3.87–5.11)
RDW: 14.7 % (ref 11.5–15.5)
WBC: 3.7 10*3/uL — ABNORMAL LOW (ref 4.0–10.5)

## 2020-02-19 LAB — LIPID PANEL
Cholesterol: 222 mg/dL — ABNORMAL HIGH (ref 0–200)
HDL: 82.5 mg/dL (ref >39.00)
LDL Cholesterol: 126 mg/dL — ABNORMAL HIGH (ref 0–99)
NonHDL: 139.2
Total CHOL/HDL Ratio: 3
Triglycerides: 67 mg/dL (ref 0.0–149.0)
VLDL: 13.4 mg/dL (ref 0.0–40.0)

## 2020-02-19 LAB — VITAMIN D 25 HYDROXY (VIT D DEFICIENCY, FRACTURES): VITD: 21.47 ng/mL — ABNORMAL LOW (ref 30.00–100.00)

## 2020-02-19 LAB — HEMOGLOBIN A1C: Hgb A1c MFr Bld: 6 % (ref 4.6–6.5)

## 2020-02-23 ENCOUNTER — Ambulatory Visit (HOSPITAL_COMMUNITY)
Admission: RE | Admit: 2020-02-23 | Discharge: 2020-02-23 | Disposition: A | Discharge status: Home / Self Care | Payer: 59 | Source: Ambulatory Visit | Attending: Internal Medicine | Admitting: Internal Medicine

## 2020-02-23 ENCOUNTER — Other Ambulatory Visit: Payer: Self-pay

## 2020-02-23 DIAGNOSIS — Z86718 Personal history of other venous thrombosis and embolism: Secondary | ICD-10-CM | POA: Insufficient documentation

## 2020-02-23 MED ORDER — VITAMIN D (ERGOCALCIFEROL) 1.25 MG (50000 UNIT) PO CAPS: 50000.0000 [IU] | ORAL_CAPSULE | ORAL | 0 refills | Status: AC

## 2020-02-23 NOTE — Addendum Note (Signed)
Addended by: Roena Malady on: 02/23/2020 04:06 PM   Modules accepted: Orders

## 2020-03-04 ENCOUNTER — Other Ambulatory Visit: Payer: Self-pay | Admitting: Internal Medicine

## 2020-03-10 ENCOUNTER — Other Ambulatory Visit: Payer: Self-pay | Admitting: Internal Medicine

## 2020-03-10 DIAGNOSIS — M0579 Rheumatoid arthritis with rheumatoid factor of multiple sites without organ or systems involvement: Secondary | ICD-10-CM

## 2020-03-12 MED ORDER — METHOCARBAMOL 500 MG PO TABS: 500.0000 mg | ORAL_TABLET | Freq: Two times a day (BID) | ORAL | 1 refills | Status: DC

## 2020-03-15 ENCOUNTER — Other Ambulatory Visit: Payer: Self-pay | Admitting: Internal Medicine

## 2020-04-06 ENCOUNTER — Other Ambulatory Visit: Payer: Self-pay | Admitting: Internal Medicine

## 2020-04-08 ENCOUNTER — Other Ambulatory Visit: Payer: Self-pay

## 2020-04-08 MED ORDER — XARELTO 20 MG PO TABS
20.0000 mg | ORAL_TABLET | Freq: Every day | ORAL | 1 refills | Status: DC
Start: 2020-04-08 — End: 2020-10-01

## 2020-05-21 ENCOUNTER — Other Ambulatory Visit: Payer: Self-pay | Admitting: Internal Medicine

## 2020-07-12 ENCOUNTER — Other Ambulatory Visit: Payer: Self-pay | Admitting: Obstetrics & Gynecology

## 2020-07-12 DIAGNOSIS — Z1231 Encounter for screening mammogram for malignant neoplasm of breast: Secondary | ICD-10-CM

## 2020-08-26 ENCOUNTER — Other Ambulatory Visit: Payer: Self-pay | Admitting: Internal Medicine

## 2020-08-26 DIAGNOSIS — M0579 Rheumatoid arthritis with rheumatoid factor of multiple sites without organ or systems involvement: Secondary | ICD-10-CM

## 2020-08-27 NOTE — Telephone Encounter (Signed)
Last filled 03/12/2020 with 1 refill... please advise

## 2020-08-31 ENCOUNTER — Other Ambulatory Visit: Payer: Self-pay

## 2020-08-31 ENCOUNTER — Ambulatory Visit
Admission: RE | Admit: 2020-08-31 | Discharge: 2020-08-31 | Disposition: A | Discharge status: Home / Self Care | Payer: 59 | Source: Ambulatory Visit | Attending: Obstetrics & Gynecology | Admitting: Obstetrics & Gynecology

## 2020-08-31 DIAGNOSIS — Z1231 Encounter for screening mammogram for malignant neoplasm of breast: Secondary | ICD-10-CM

## 2020-08-31 LAB — HM MAMMOGRAPHY

## 2020-09-08 LAB — HM PAP SMEAR

## 2020-10-01 ENCOUNTER — Other Ambulatory Visit: Payer: Self-pay | Admitting: Internal Medicine

## 2020-10-01 NOTE — Telephone Encounter (Signed)
Last filled 03/2020 90 day supply with 1 refill, last OV 01/2020

## 2020-10-21 ENCOUNTER — Ambulatory Visit: Payer: 59 | Admitting: Family Medicine

## 2020-10-21 ENCOUNTER — Encounter: Payer: Self-pay | Admitting: Family Medicine

## 2020-10-21 ENCOUNTER — Other Ambulatory Visit: Payer: Self-pay

## 2020-10-21 VITALS — BP 108/82 | HR 67 | Temp 97.0°F | Ht 62.25 in | Wt 248.0 lb

## 2020-10-21 DIAGNOSIS — Z23 Encounter for immunization: Secondary | ICD-10-CM

## 2020-10-21 DIAGNOSIS — L309 Dermatitis, unspecified: Secondary | ICD-10-CM

## 2020-10-21 MED ORDER — TRIAMCINOLONE ACETONIDE 0.5 % EX CREA: 1.0000 "application " | TOPICAL_CREAM | Freq: Two times a day (BID) | CUTANEOUS | 0 refills | Status: DC

## 2020-10-21 NOTE — Patient Instructions (Signed)
Apply topical steroid cream x 2 weeks.  if not improving as expected call for dermatology referral.

## 2020-10-21 NOTE — Progress Notes (Signed)
Patient ID: Angel Lee, female    DOB: 1960-07-25, 60 y.o.   MRN: [id number]  This visit was conducted in person.  Pulse 67   Temp (!) 97 F (36.1 C) (Temporal)   Ht 5' 2.25" (1.581 m)   Wt 248 lb (112.5 kg)   SpO2 96%   BMI 45.00 kg/m    CC:  Chief Complaint  Patient presents with   Referral    Dermatologist for eczema    Subjective:   HPI: Angel Lee is a 60 y.o. female presenting on 10/21/2020 for Referral (Dermatologist for eczema)  She has had rash on left posterior elbow... darker skin, occ flaky skin, dry, scaly occ sore.  She has been itchy at waist. She leans on her arms.Marland Kitchen also sleeps on that side. She has tried OTC creams, tea tree oil, She is seen at rheumatologist for rheumatoid arthritis.       Relevant past medical, surgical, family and social history reviewed and updated as indicated. Interim medical history since our last visit reviewed. Allergies and medications reviewed and updated. Outpatient Medications Prior to Visit  Medication Sig Dispense Refill   amLODipine (NORVASC) 5 MG tablet TAKE 1 TABLET BY MOUTH EVERY DAY 90 tablet 2   atenolol (TENORMIN) 50 MG tablet TAKE 1 TABLET BY MOUTH EVERY DAY 90 tablet 2   atorvastatin (LIPITOR) 20 MG tablet Take 1 tablet (20 mg total) by mouth daily. 90 tablet 2   celecoxib (CELEBREX) 200 MG capsule Take 1 capsule (200 mg total) by mouth daily. 90 capsule 2   DEXILANT 30 MG capsule TAKE 1 CAPSULE BY MOUTH EVERY DAY 90 capsule 2   folic acid (FOLVITE) 1 MG tablet TAKE 1 TABLET (1 MG TOTAL) BY MOUTH DAILY. 90 tablet 1   HUMIRA PEN 40 MG/0.4ML PNKT SMARTSIG:40 Milligram(s) SUB-Q Every 2 Weeks     leflunomide (ARAVA) 20 MG tablet Take 20 mg by mouth daily.     methocarbamol (ROBAXIN) 500 MG tablet TAKE 1 TABLET BY MOUTH TWICE A DAY 180 tablet 1   Vitamin D, Ergocalciferol, (DRISDOL) 1.25 MG (50000 UNIT) CAPS capsule Take 1 capsule (50,000 Units total) by mouth every 7 (seven) days. 12 capsule 0   XARELTO  20 MG TABS tablet TAKE 1 TABLET BY MOUTH DAILY WITH SUPPER. 90 tablet 0   methotrexate 50 MG/2ML injection Inject 50 mg/m2 into the muscle once a week.   0   No facility-administered medications prior to visit.     Per HPI unless specifically indicated in ROS section below Review of Systems  All other systems reviewed and are negative. Objective:  Pulse 67   Temp (!) 97 F (36.1 C) (Temporal)   Ht 5' 2.25" (1.581 m)   Wt 248 lb (112.5 kg)   SpO2 96%   BMI 45.00 kg/m   Wt Readings from Last 3 Encounters:  10/21/20 248 lb (112.5 kg)  02/18/20 254 lb (115.2 kg)  06/03/19 256 lb (116.1 kg)      Physical Exam Constitutional:      General: She is not in acute distress.    Appearance: Normal appearance. She is well-developed. She is not ill-appearing or toxic-appearing.  HENT:     Head: Normocephalic.     Right Ear: Hearing, tympanic membrane, ear canal and external ear normal. Tympanic membrane is not erythematous, retracted or bulging.     Left Ear: Hearing, tympanic membrane, ear canal and external ear normal. Tympanic membrane is not erythematous, retracted or  bulging.     Nose: No mucosal edema or rhinorrhea.     Right Sinus: No maxillary sinus tenderness or frontal sinus tenderness.     Left Sinus: No maxillary sinus tenderness or frontal sinus tenderness.     Mouth/Throat:     Pharynx: Uvula midline.  Eyes:     General: Lids are normal. Lids are everted, no foreign bodies appreciated.     Conjunctiva/sclera: Conjunctivae normal.     Pupils: Pupils are equal, round, and reactive to light.  Neck:     Thyroid: No thyroid mass or thyromegaly.     Vascular: No carotid bruit.     Trachea: Trachea normal.  Cardiovascular:     Rate and Rhythm: Normal rate and regular rhythm.     Pulses: Normal pulses.     Heart sounds: Normal heart sounds, S1 normal and S2 normal. No murmur heard.   No friction rub. No gallop.  Pulmonary:     Effort: Pulmonary effort is normal. No tachypnea  or respiratory distress.     Breath sounds: Normal breath sounds. No decreased breath sounds, wheezing, rhonchi or rales.  Abdominal:     General: Bowel sounds are normal.     Palpations: Abdomen is soft.     Tenderness: There is no abdominal tenderness.  Musculoskeletal:     Cervical back: Normal range of motion and neck supple.  Skin:    General: Skin is warm and dry.     Findings: No rash.     Comments: Left posterior elbow:darker flaky skin, dry, scaly   Neurological:     Mental Status: She is alert.  Psychiatric:        Mood and Affect: Mood is not anxious or depressed.        Speech: Speech normal.        Behavior: Behavior normal. Behavior is cooperative.        Thought Content: Thought content normal.        Judgment: Judgment normal.      Results for orders placed or performed in visit on 02/18/20  CBC  Result Value Ref Range   WBC 3.7 (L) 4.0 - 10.5 K/uL   RBC 4.75 3.87 - 5.11 Mil/uL   Platelets 222.0 150.0 - 400.0 K/uL   Hemoglobin 14.5 12.0 - 15.0 g/dL   HCT 09.3 23.5 - 57.3 %   MCV 93.8 78.0 - 100.0 fl   MCHC 32.6 30.0 - 36.0 g/dL   RDW 22.0 25.4 - 27.0 %  Comprehensive metabolic panel  Result Value Ref Range   Sodium 138 135 - 145 mEq/L   Potassium 3.9 3.5 - 5.1 mEq/L   Chloride 103 96 - 112 mEq/L   CO2 25 19 - 32 mEq/L   Glucose, Bld 80 70 - 99 mg/dL   BUN 11 6 - 23 mg/dL   Creatinine, Ser 6.23 0.40 - 1.20 mg/dL   Total Bilirubin 0.5 0.2 - 1.2 mg/dL   Alkaline Phosphatase 61 39 - 117 U/L   AST 19 0 - 37 U/L   ALT 16 0 - 35 U/L   Total Protein 7.6 6.0 - 8.3 g/dL   Albumin 4.1 3.5 - 5.2 g/dL   GFR 76.28 >31.51 mL/min   Calcium 9.3 8.4 - 10.5 mg/dL  Lipid panel  Result Value Ref Range   Cholesterol 222 (H) 0 - 200 mg/dL   Triglycerides 76.1 0.0 - 149.0 mg/dL   HDL 60.73 >71.06 mg/dL   VLDL 26.9 0.0 - 40.0  mg/dL   LDL Cholesterol 449 (H) 0 - 99 mg/dL   Total CHOL/HDL Ratio 3    NonHDL 139.20   Hemoglobin A1c  Result Value Ref Range   Hgb A1c  MFr Bld 6.0 4.6 - 6.5 %  VITAMIN D 25 Hydroxy (Vit-D Deficiency, Fractures)  Result Value Ref Range   VITD 21.47 (L) 30.00 - 100.00 ng/mL    This visit occurred during the SARS-CoV-2 public health emergency.  Safety protocols were in place, including screening questions prior to the visit, additional usage of staff PPE, and extensive cleaning of exam room while observing appropriate contact time as indicated for disinfecting solutions.   COVID 19 screen:  No recent travel or known exposure to COVID19 The patient denies respiratory symptoms of COVID 19 at this time. The importance of social distancing was discussed today.   Assessment and Plan    Problem List Items Addressed This Visit     Dermatitis - Primary    Apply topical steroid cream x 2 weeks.  if not improving as expected call for dermatology referral.       Other Visit Diagnoses     Need for shingles vaccine       Relevant Orders   Varicella-zoster vaccine IM (Completed)   Need for Tdap vaccination       Relevant Orders   Tdap vaccine greater than or equal to 7yo IM (Completed)      Orders Placed This Encounter  Procedures   Tdap vaccine greater than or equal to 7yo IM   Varicella-zoster vaccine IM      Kerby Nora, MD

## 2020-11-21 ENCOUNTER — Other Ambulatory Visit: Payer: Self-pay | Admitting: Internal Medicine

## 2020-11-25 NOTE — Telephone Encounter (Signed)
Last office visit 10/21/2020 with Dr. Ermalene Searing for dermatitis.  Last refilled 03/12/2020 for #90 with 2 refills.  No TOC appointments.

## 2020-11-30 ENCOUNTER — Other Ambulatory Visit: Payer: Self-pay | Admitting: Internal Medicine

## 2020-12-08 ENCOUNTER — Telehealth (INDEPENDENT_AMBULATORY_CARE_PROVIDER_SITE_OTHER): Payer: 59 | Admitting: Family Medicine

## 2020-12-08 ENCOUNTER — Other Ambulatory Visit: Payer: Self-pay

## 2020-12-08 ENCOUNTER — Telehealth: Payer: Self-pay | Admitting: *Deleted

## 2020-12-08 VITALS — Temp 99.2°F

## 2020-12-08 DIAGNOSIS — R059 Cough, unspecified: Secondary | ICD-10-CM

## 2020-12-08 DIAGNOSIS — L309 Dermatitis, unspecified: Secondary | ICD-10-CM | POA: Insufficient documentation

## 2020-12-08 DIAGNOSIS — U071 COVID-19: Secondary | ICD-10-CM | POA: Diagnosis not present

## 2020-12-08 MED ORDER — BENZONATATE 100 MG PO CAPS
100.0000 mg | ORAL_CAPSULE | Freq: Three times a day (TID) | ORAL | 0 refills | Status: DC | PRN
Start: 2020-12-08 — End: 2021-04-27

## 2020-12-08 MED ORDER — AZITHROMYCIN 250 MG PO TABS: ORAL_TABLET | ORAL | 0 refills | Status: AC

## 2020-12-08 NOTE — Assessment & Plan Note (Signed)
Apply topical steroid cream x 2 weeks.  if not improving as expected call for dermatology referral. 

## 2020-12-08 NOTE — Progress Notes (Signed)
Virtual Visit via Video Note  I connected with Angel Lee on 12/08/20 at 1:18 PM by a video enabled telemedicine application and verified that I am speaking with the correct person using two identifiers.  Patient location:home My location: office - [place]   I discussed the limitations, risks, security and privacy concerns of performing an evaluation and management service by telephone and the availability of in person appointments. I also discussed with the patient that there may be a patient responsible charge related to this service. The patient expressed understanding and agreed to proceed, consent obtained  Chief complaint:  Chief Complaint  Patient presents with   Covid Positive    Pt checking in felt bad starting Tuesday once week ago, pt feeling better today notes she is still having cough productive     History of Present Illness: Angel Lee is a 60 y.o. female  COVID-19 infection Initial symptoms Tuesday, July 5, had traveled prior weekend. Mild sore throat, sneezing, congestion, cough. More bdy aches, fatigue next few days.  Positive test July 7th.  Symptoms are improving, still with some cough, productive cough with discolored phlegm, but lighter in color past few days.  No fever, no dyspnea. No confusion, drinking fluids, working form home. No chest pains.  Tx: robitussin DM.- helping cough. Sleeping ok with this med.   COVID-vaccine April 2021 with booster in November.  No 2nd booster. Risk of complications score of 3.  Patient Active Problem List   Diagnosis Date Noted   OA (osteoarthritis) 02/18/2020   Rheumatoid arthritis involving multiple sites with positive rheumatoid factor (HCC) 10/07/2015   GERD (gastroesophageal reflux disease) 10/07/2015   Seasonal allergies 10/07/2015   HTN (hypertension) 10/07/2015   HLD (hyperlipidemia) 10/07/2015   History of DVT (deep vein thrombosis) 10/07/2015   History of pulmonary embolism 10/07/2015   Past Medical  History:  Diagnosis Date   Allergy    Chicken pox    DVT (deep venous thrombosis) (HCC) 2011   GERD (gastroesophageal reflux disease)    Hyperlipidemia    Hypertension    PE (pulmonary embolism) 2011   Rheumatoid arthritis [facility])    Past Surgical History:  Procedure Laterality Date   FOOT SURGERY Right 2003   KNEE ARTHROSCOPY Bilateral 1981, 1998, 2007   ROTATOR CUFF REPAIR Bilateral 2000, 2005   wisdom teeth     Allergies  Allergen Reactions   Erythromycin Nausea And Vomiting   Sulfa Antibiotics Hives   Typhoid Vaccines Other (See Comments)    Pain, inflammation    Prior to Admission medications   Medication Sig Start Date End Date Taking? Authorizing Provider  amLODipine (NORVASC) 5 MG tablet TAKE 1 TABLET BY MOUTH EVERY DAY 11/25/20  Yes Worthy Rancher B, FNP  atenolol (TENORMIN) 50 MG tablet TAKE 1 TABLET BY MOUTH EVERY DAY 03/12/20  Yes Baity, Salvadore Oxford, NP  atorvastatin (LIPITOR) 20 MG tablet Take 1 tablet (20 mg total) by mouth daily. 04/07/20  Yes Lorre Munroe, NP  celecoxib (CELEBREX) 200 MG capsule Take 1 capsule (200 mg total) by mouth daily. 06/28/17  Yes Lorre Munroe, NP  Dexlansoprazole 30 MG capsule TAKE 1 CAPSULE BY MOUTH EVERY DAY 11/30/20  Yes Worthy Rancher B, FNP  folic acid (FOLVITE) 1 MG tablet TAKE 1 TABLET (1 MG TOTAL) BY MOUTH DAILY. 03/29/16  Yes Lorre Munroe, NP  HUMIRA PEN 40 MG/0.4ML PNKT SMARTSIG:40 Milligram(s) SUB-Q Every 2 Weeks 01/23/20  Yes [provider]  leflunomide (ARAVA) 20 MG  tablet Take 20 mg by mouth daily.   Yes [provider]  methocarbamol (ROBAXIN) 500 MG tablet TAKE 1 TABLET BY MOUTH TWICE A DAY 08/27/20  Yes Baity, Salvadore Oxford, NP  triamcinolone cream (KENALOG) 0.5 % Apply 1 application topically 2 (two) times daily. 10/21/20  Yes Bedsole, Amy E, MD  Vitamin D, Ergocalciferol, (DRISDOL) 1.25 MG (50000 UNIT) CAPS capsule Take 1 capsule (50,000 Units total) by mouth every 7 (seven) days. 02/23/20  Yes Baity, Salvadore Oxford, NP   XARELTO 20 MG TABS tablet TAKE 1 TABLET BY MOUTH DAILY WITH SUPPER. 10/02/20  Yes Eulis Foster, FNP   Social History   Socioeconomic History   Marital status: Single    Spouse name: Not on file   Number of children: Not on file   Years of education: Not on file   Highest education level: Not on file  Occupational History   Not on file  Tobacco Use   Smoking status: Never   Smokeless tobacco: Never  Substance and Sexual Activity   Alcohol use: No   Drug use: No   Sexual activity: Not on file  Other Topics Concern   Not on file  Social History Narrative   Not on file   Social Determinants of Health   Financial Resource Strain: Not on file  Food Insecurity: Not on file  Transportation Needs: Not on file  Physical Activity: Not on file  Stress: Not on file  Social Connections: Not on file  Intimate Partner Violence: Not on file    Observations/Objective: Vitals:   12/08/20 1233  Temp: 99.2 F (37.3 C)   Nontoxic appearance on video, no respiratory distress.  Speaking full sentences.  Minimal cough during exam.  Euthymic mood.  All questions were answered with understanding of plan expressed  Assessment and Plan: COVID-19 virus infection - Plan: benzonatate (TESSALON) 100 MG capsule, azithromycin (ZITHROMAX) 250 MG tablet  Cough - Plan: benzonatate (TESSALON) 100 MG capsule, azithromycin (ZITHROMAX) 250 MG tablet  Suspected continued viral syndrome, overall improving with some persistent cough.  Discoloration of mucus but has slightly improved in color.  We will continue symptomatic care with Mucinex DM or Robitussin-DM, prescribed Tessalon Perles 3 times daily as needed, fluids, rest and ER precautions.  If not improving in the next day or 2 or worsening cough, start azithromycin for secondary bacterial infection, pneumonia, bronchitis.  Holding Humira, plans to discuss the Arava with her rheumatologist. Follow Up Instructions:    I discussed the assessment and  treatment plan with the patient. The patient was provided an opportunity to ask questions and all were answered. The patient agreed with the plan and demonstrated an understanding of the instructions.   The patient was advised to call back or seek an in-person evaluation if the symptoms worsen or if the condition fails to improve as anticipated.   Shade Flood, MD

## 2020-12-08 NOTE — Telephone Encounter (Signed)
Patient left a voicemail stating that she tested positive for covid last Thursday. Patient stated that she has been in quarantine. Patient stated that she still has a bad cough and chest congestion. Patient denies a fever. Patient stated that she has had her covid vaccines.  Tried to call patient back and got her voicemail. Left a message on voicemail for patient to call the office back.

## 2020-12-08 NOTE — Telephone Encounter (Signed)
Patient called back stating that she started with symptoms last Tuesday and tested positive for covid with a home test Thursday. Patient stated that she has been taking Mucinex DM for the congestion. Patient stated that she has a productive cough/light brown. Patient denies a fever but has had chills. Patient denies SOB or difficulty breathing. Patient stated that she still has a bad cough and chest congestion. Patient scheduled for a virtual visit with Dr. Neva Seat at 1:00 pm. Patient was given ER precautions and she verbalized understanding. Patient stated that she does not plan on following Nicki Reaper NP to her new location. Patient lives in [place] and was given telephone numbers for [facility] offices in her area.

## 2020-12-23 ENCOUNTER — Other Ambulatory Visit: Payer: Self-pay | Admitting: Internal Medicine

## 2020-12-23 NOTE — Telephone Encounter (Signed)
Last OV - 10/21/2020 Next OV - N/A Last Filled - 03/12/2020

## 2021-01-01 ENCOUNTER — Other Ambulatory Visit: Payer: Self-pay | Admitting: Family

## 2021-02-03 ENCOUNTER — Other Ambulatory Visit: Payer: Self-pay | Admitting: Family Medicine

## 2021-02-03 ENCOUNTER — Other Ambulatory Visit: Payer: Self-pay | Admitting: Internal Medicine

## 2021-02-03 DIAGNOSIS — M0579 Rheumatoid arthritis with rheumatoid factor of multiple sites without organ or systems involvement: Secondary | ICD-10-CM

## 2021-02-03 NOTE — Telephone Encounter (Signed)
Last office visit 10/21/2020 with Dr. Ermalene Searing for dermatitis.  Last refilled 10/21/2020 for 30 g with no refills.  Patient has TOC appointment with Sanda Linger 03/31/2021.

## 2021-02-04 NOTE — Telephone Encounter (Signed)
   Notes to clinic Not a pt in this practice.  

## 2021-02-14 NOTE — Telephone Encounter (Signed)
Last office visit 12/08/20 virtual acute Upcoming appointment Dr. Sanda Linger 03/31/21 Last refill Robaxin 08/27/20 #180/1 Atorvastatin last refill 04/07/20 #90/2

## 2021-03-31 ENCOUNTER — Ambulatory Visit: Payer: 59 | Admitting: Internal Medicine

## 2021-03-31 ENCOUNTER — Encounter: Payer: Self-pay | Admitting: Internal Medicine

## 2021-03-31 ENCOUNTER — Other Ambulatory Visit: Payer: Self-pay

## 2021-03-31 VITALS — BP 138/88 | HR 60 | Temp 97.9°F | Resp 16 | Ht 62.25 in | Wt 243.0 lb

## 2021-03-31 DIAGNOSIS — E559 Vitamin D deficiency, unspecified: Secondary | ICD-10-CM

## 2021-03-31 DIAGNOSIS — Z6841 Body Mass Index (BMI) 40.0 and over, adult: Secondary | ICD-10-CM | POA: Diagnosis not present

## 2021-03-31 DIAGNOSIS — Z0001 Encounter for general adult medical examination with abnormal findings: Secondary | ICD-10-CM

## 2021-03-31 DIAGNOSIS — D6859 Other primary thrombophilia: Secondary | ICD-10-CM | POA: Diagnosis not present

## 2021-03-31 DIAGNOSIS — I10 Essential (primary) hypertension: Secondary | ICD-10-CM | POA: Diagnosis not present

## 2021-03-31 DIAGNOSIS — E7800 Pure hypercholesterolemia, unspecified: Secondary | ICD-10-CM | POA: Diagnosis not present

## 2021-03-31 DIAGNOSIS — R7303 Prediabetes: Secondary | ICD-10-CM

## 2021-03-31 LAB — CBC WITH DIFFERENTIAL/PLATELET
Basophils Absolute: 0 10*3/uL (ref 0.0–0.1)
Basophils Relative: 1.3 % (ref 0.0–3.0)
Eosinophils Absolute: 0.2 10*3/uL (ref 0.0–0.7)
Eosinophils Relative: 6.3 % — ABNORMAL HIGH (ref 0.0–5.0)
HCT: 45.4 % (ref 36.0–46.0)
Hemoglobin: 14.6 g/dL (ref 12.0–15.0)
Lymphocytes Relative: 52.7 % — ABNORMAL HIGH (ref 12.0–46.0)
Lymphs Abs: 1.4 10*3/uL (ref 0.7–4.0)
MCHC: 32.2 g/dL (ref 30.0–36.0)
MCV: 92.2 fl (ref 78.0–100.0)
Monocytes Absolute: 0.3 10*3/uL (ref 0.1–1.0)
Monocytes Relative: 12.6 % — ABNORMAL HIGH (ref 3.0–12.0)
Neutro Abs: 0.7 10*3/uL — ABNORMAL LOW (ref 1.4–7.7)
Neutrophils Relative %: 27.1 % — ABNORMAL LOW (ref 43.0–77.0)
Platelets: 164 10*3/uL (ref 150.0–400.0)
RBC: 4.92 Mil/uL (ref 3.87–5.11)
RDW: 14.5 % (ref 11.5–15.5)
WBC: 2.7 10*3/uL — ABNORMAL LOW (ref 4.0–10.5)

## 2021-03-31 LAB — URINALYSIS, ROUTINE W REFLEX MICROSCOPIC
Bilirubin Urine: NEGATIVE
Hgb urine dipstick: NEGATIVE
Ketones, ur: NEGATIVE
Leukocytes,Ua: NEGATIVE
Nitrite: NEGATIVE
RBC / HPF: NONE SEEN (ref 0–?)
Specific Gravity, Urine: 1.01 (ref 1.000–1.030)
Total Protein, Urine: NEGATIVE
Urine Glucose: NEGATIVE
Urobilinogen, UA: 0.2 (ref 0.0–1.0)
pH: 6 (ref 5.0–8.0)

## 2021-03-31 LAB — BASIC METABOLIC PANEL
BUN: 9 mg/dL (ref 6–23)
CO2: 27 mEq/L (ref 19–32)
Calcium: 9.4 mg/dL (ref 8.4–10.5)
Chloride: 104 mEq/L (ref 96–112)
Creatinine, Ser: 0.8 mg/dL (ref 0.40–1.20)
GFR: 80.38 mL/min (ref >60.00)
Glucose, Bld: 86 mg/dL (ref 70–99)
Potassium: 3.8 mEq/L (ref 3.5–5.1)
Sodium: 139 mEq/L (ref 135–145)

## 2021-03-31 LAB — HEPATIC FUNCTION PANEL
ALT: 24 U/L (ref 0–35)
AST: 28 U/L (ref 0–37)
Albumin: 4.2 g/dL (ref 3.5–5.2)
Alkaline Phosphatase: 59 U/L (ref 39–117)
Bilirubin, Direct: 0 mg/dL (ref 0.0–0.3)
Total Bilirubin: 0.3 mg/dL (ref 0.2–1.2)
Total Protein: 7.7 g/dL (ref 6.0–8.3)

## 2021-03-31 LAB — LIPID PANEL
Cholesterol: 223 mg/dL — ABNORMAL HIGH (ref 0–200)
HDL: 79.5 mg/dL (ref >39.00)
LDL Cholesterol: 123 mg/dL — ABNORMAL HIGH (ref 0–99)
NonHDL: 143.49
Total CHOL/HDL Ratio: 3
Triglycerides: 101 mg/dL (ref 0.0–149.0)
VLDL: 20.2 mg/dL (ref 0.0–40.0)

## 2021-03-31 LAB — HEMOGLOBIN A1C: Hgb A1c MFr Bld: 5.6 % (ref 4.6–6.5)

## 2021-03-31 LAB — TSH: TSH: 1.97 u[IU]/mL (ref 0.35–5.50)

## 2021-03-31 LAB — VITAMIN D 25 HYDROXY (VIT D DEFICIENCY, FRACTURES): VITD: 28.73 ng/mL — ABNORMAL LOW (ref 30.00–100.00)

## 2021-03-31 MED ORDER — CHOLECALCIFEROL 50 MCG (2000 UT) PO TABS
1.0000 | ORAL_TABLET | Freq: Every day | ORAL | 1 refills | Status: DC
Start: 2021-03-31 — End: 2021-08-24

## 2021-03-31 NOTE — Patient Instructions (Signed)

## 2021-03-31 NOTE — Progress Notes (Signed)
Subjective:  Patient ID: Angel Lee, female    DOB: 06-13-1960  Age: 60 y.o. MRN: [id number]  CC: Annual Exam and Hypertension  This visit occurred during the SARS-CoV-2 public health emergency.  Safety protocols were in place, including screening questions prior to the visit, additional usage of staff PPE, and extensive cleaning of exam room while observing appropriate contact time as indicated for disinfecting solutions.    HPI Angel Lee presents for a CPX and to establish.  She has had several thromboembolic events including DVT and PE.  It sounds like they are provoked by surgical interventions or long travel.  Her brother has had 2 DVTs.  She is anticoagulated with a DOAC.  She denies chest pain, shortness of breath, lower extremity edema, palpitations, dyspnea on exertion, dizziness, or lightheadedness.   Outpatient Medications Prior to Visit  Medication Sig Dispense Refill   amLODipine (NORVASC) 5 MG tablet TAKE 1 TABLET BY MOUTH EVERY DAY 90 tablet 2   atenolol (TENORMIN) 50 MG tablet TAKE 1 TABLET BY MOUTH EVERY DAY 90 tablet 2   atorvastatin (LIPITOR) 20 MG tablet TAKE 1 TABLET BY MOUTH EVERY DAY 90 tablet 0   benzonatate (TESSALON) 100 MG capsule Take 1 capsule (100 mg total) by mouth 3 (three) times daily as needed for cough. 20 capsule 0   celecoxib (CELEBREX) 200 MG capsule Take 1 capsule (200 mg total) by mouth daily. 90 capsule 2   Dexlansoprazole 30 MG capsule TAKE 1 CAPSULE BY MOUTH EVERY DAY 90 capsule 2   HUMIRA PEN 40 MG/0.4ML PNKT SMARTSIG:40 Milligram(s) SUB-Q Every 2 Weeks     leflunomide (ARAVA) 20 MG tablet Take 20 mg by mouth daily.     methocarbamol (ROBAXIN) 500 MG tablet TAKE 1 TABLET BY MOUTH TWICE A DAY 180 tablet 0   triamcinolone cream (KENALOG) 0.5 % APPLY TO AFFECTED AREA TWICE A DAY 30 g 0   Vitamin D, Ergocalciferol, (DRISDOL) 1.25 MG (50000 UNIT) CAPS capsule Take 1 capsule (50,000 Units total) by mouth every 7 (seven) days. 12 capsule 0    XARELTO 20 MG TABS tablet TAKE 1 TABLET BY MOUTH DAILY WITH SUPPER. 90 tablet 0   folic acid (FOLVITE) 1 MG tablet TAKE 1 TABLET (1 MG TOTAL) BY MOUTH DAILY. 90 tablet 1   No facility-administered medications prior to visit.    ROS Review of Systems  Constitutional:  Negative for chills, diaphoresis, fatigue and fever.  HENT: Negative.    Eyes: Negative.  Negative for visual disturbance.  Respiratory:  Negative for cough, shortness of breath and wheezing.   Cardiovascular:  Negative for chest pain, palpitations and leg swelling.  Gastrointestinal:  Negative for abdominal pain, constipation, diarrhea and vomiting.  Endocrine: Negative.   Genitourinary: Negative.  Negative for hematuria.  Musculoskeletal:  Positive for arthralgias. Negative for back pain and myalgias.  Skin: Negative.  Negative for color change and pallor.  Neurological:  Negative for dizziness, weakness, light-headedness and numbness.  Hematological:  Negative for adenopathy. Does not bruise/bleed easily.  Psychiatric/Behavioral: Negative.     Objective:  BP 138/88 (BP Location: Left Arm, Patient Position: Sitting, Cuff Size: Large)   Pulse 60   Temp 97.9 F (36.6 C) (Oral)   Resp 16   Ht 5' 2.25" (1.581 m)   Wt 243 lb (110.2 kg)   SpO2 97%   BMI 44.09 kg/m   BP Readings from Last 3 Encounters:  03/31/21 138/88  10/21/20 108/82  02/18/20 124/84  Wt Readings from Last 3 Encounters:  03/31/21 243 lb (110.2 kg)  10/21/20 248 lb (112.5 kg)  02/18/20 254 lb (115.2 kg)    Physical Exam Vitals reviewed.  Constitutional:      General: She is not in acute distress.    Appearance: She is obese. She is not toxic-appearing or diaphoretic.  HENT:     Nose: Nose normal.     Mouth/Throat:     Mouth: Mucous membranes are moist.  Eyes:     General: No scleral icterus.    Conjunctiva/sclera: Conjunctivae normal.  Cardiovascular:     Rate and Rhythm: Normal rate and regular rhythm.     Heart sounds: No  murmur heard. Pulmonary:     Effort: Pulmonary effort is normal.     Breath sounds: No stridor. No wheezing, rhonchi or rales.  Abdominal:     General: Abdomen is protuberant. Bowel sounds are normal. There is no distension.     Palpations: Abdomen is soft. There is no hepatomegaly, splenomegaly or mass.     Tenderness: There is no abdominal tenderness. There is no guarding.  Musculoskeletal:        General: Deformity (DJD both knees) present. Normal range of motion.     Cervical back: Neck supple.     Right lower leg: No edema.     Left lower leg: No edema.  Lymphadenopathy:     Cervical: No cervical adenopathy.  Skin:    General: Skin is warm and dry.     Findings: No rash.  Neurological:     General: No focal deficit present.     Mental Status: She is alert.  Psychiatric:        Mood and Affect: Mood normal.        Behavior: Behavior normal.    Lab Results  Component Value Date   WBC 2.7 (L) 03/31/2021   HGB 14.6 03/31/2021   HCT 45.4 03/31/2021   PLT 164.0 03/31/2021   GLUCOSE 86 03/31/2021   CHOL 223 (H) 03/31/2021   TRIG 101.0 03/31/2021   HDL 79.50 03/31/2021   LDLCALC 123 (H) 03/31/2021   ALT 24 03/31/2021   AST 28 03/31/2021   NA 139 03/31/2021   K 3.8 03/31/2021   CL 104 03/31/2021   CREATININE 0.80 03/31/2021   BUN 9 03/31/2021   CO2 27 03/31/2021   TSH 1.97 03/31/2021   HGBA1C 5.6 03/31/2021    MM 3D SCREEN BREAST BILATERAL  Result Date: 09/02/2020 CLINICAL DATA:  Screening. EXAM: DIGITAL SCREENING BILATERAL MAMMOGRAM WITH TOMOSYNTHESIS AND CAD TECHNIQUE: Bilateral screening digital craniocaudal and mediolateral oblique mammograms were obtained. Bilateral screening digital breast tomosynthesis was performed. The images were evaluated with computer-aided detection. COMPARISON:  Previous exam(s). ACR Breast Density Category a: The breast tissue is almost entirely fatty. FINDINGS: There are no findings suspicious for malignancy. The images were evaluated  with computer-aided detection. IMPRESSION: No mammographic evidence of malignancy. A result letter of this screening mammogram will be mailed directly to the patient. RECOMMENDATION: Screening mammogram in one year. (Code:SM-B-01Y) BI-RADS CATEGORY  1: Negative. Electronically Signed   By: Frederico Hamman M.D.   On: 09/02/2020 09:29    Assessment & Plan:   Dimitra was seen today for annual exam and hypertension.  Diagnoses and all orders for this visit:  Primary hypertension- Her blood pressure is adequately well controlled. -     CBC with Differential/Platelet; Future -     TSH; Future -     Urinalysis,  Routine w reflex microscopic; Future -     Hepatic function panel; Future -     VITAMIN D 25 Hydroxy (Vit-D Deficiency, Fractures); Future -     VITAMIN D 25 Hydroxy (Vit-D Deficiency, Fractures) -     Hepatic function panel -     Urinalysis, Routine w reflex microscopic -     TSH -     CBC with Differential/Platelet  Prediabetes- Her A1c is normal now. -     Basic metabolic panel; Future -     Hemoglobin A1c; Future -     Hemoglobin A1c -     Basic metabolic panel  Encounter for general adult medical examination with abnormal findings- Exam completed, labs reviewed, vaccines reviewed-she refused a flu vaccine, cancer screenings are up-to-date, patient education was given. -     Lipid panel; Future -     Lipid panel  Class 3 severe obesity due to excess calories with serious comorbidity and body mass index (BMI) of 40.0 to 44.9 in adult [facility])- Labs are negative for secondary causes or complications. -     Basic metabolic panel; Future -     TSH; Future -     Hepatic function panel; Future -     Hemoglobin A1c; Future -     Hemoglobin A1c -     Hepatic function panel -     TSH -     Basic metabolic panel  Pure hypercholesterolemia- Statin therapy is not indicated. -     Hepatic function panel; Future -     Hepatic function panel  Hypercoagulable state, primary ([place])- I  will screen for secondary causes of hypercoagulability. -     Hypercoagulable panel, comprehensive; Future -     Hypercoagulable panel, comprehensive  Vitamin D deficiency disease -     Cholecalciferol 50 MCG (2000 UT) TABS; Take 1 tablet (2,000 Units total) by mouth daily.  I have discontinued Ayde V. Wilhelmsen's folic acid. I am also having her start on Cholecalciferol. Additionally, I am having her maintain her celecoxib, Humira Pen, Vitamin D (Ergocalciferol), Xarelto, leflunomide, amLODipine, Dexlansoprazole, benzonatate, atenolol, triamcinolone cream, methocarbamol, and atorvastatin.  Meds ordered this encounter  Medications   Cholecalciferol 50 MCG (2000 UT) TABS    Sig: Take 1 tablet (2,000 Units total) by mouth daily.    Dispense:  90 tablet    Refill:  1      Follow-up: Return in about 6 months (around 09/28/2021).  Scarlette Calico, MD

## 2021-04-06 ENCOUNTER — Encounter: Payer: Self-pay | Admitting: Internal Medicine

## 2021-04-11 ENCOUNTER — Other Ambulatory Visit: Payer: Self-pay | Admitting: Family

## 2021-04-11 ENCOUNTER — Other Ambulatory Visit: Payer: Self-pay | Admitting: Internal Medicine

## 2021-04-11 DIAGNOSIS — M0579 Rheumatoid arthritis with rheumatoid factor of multiple sites without organ or systems involvement: Secondary | ICD-10-CM

## 2021-04-13 LAB — HYPERCOAGULABLE PANEL, COMPREHENSIVE
APTT: 25.6 s
AT III Act/Nor PPP Chro: 107 %
Act. Prt C Resist w/FV Defic.: 2.7 ratio
Anticardiolipin Ab, IgG: 13 [GPL'U]
Anticardiolipin Ab, IgM: 14 [MPL'U]
Beta-2 Glycoprotein I, IgA: 11 SAU
Beta-2 Glycoprotein I, IgG: <10 SGU
Beta-2 Glycoprotein I, IgM: <10 SMU
DRVVT Screen Seconds: 42.2 s
Factor VII Antigen**: 114 %
Factor VIII Activity: 201 % — ABNORMAL HIGH
Hexagonal Phospholipid Neutral: 3 s
Homocysteine: 13.9 umol/L
Prot C Ag Act/Nor PPP Imm: 96 %
Prot S Ag Act/Nor PPP Imm: 159 % — ABNORMAL HIGH
Protein C Ag/FVII Ag Ratio**: 0.8 ratio
Protein S Ag/FVII Ag Ratio**: 1.4 ratio

## 2021-04-13 MED ORDER — RIVAROXABAN 10 MG PO TABS: 10.0000 mg | ORAL_TABLET | Freq: Every day | ORAL | 1 refills | Status: DC

## 2021-04-13 NOTE — Addendum Note (Signed)
Addended by: Etta Grandchild on: 04/13/2021 01:39 PM   Modules accepted: Orders

## 2021-04-27 ENCOUNTER — Telehealth: Payer: Self-pay | Admitting: Internal Medicine

## 2021-04-27 ENCOUNTER — Other Ambulatory Visit: Payer: Self-pay | Admitting: Internal Medicine

## 2021-04-27 DIAGNOSIS — K219 Gastro-esophageal reflux disease without esophagitis: Secondary | ICD-10-CM

## 2021-04-27 DIAGNOSIS — I10 Essential (primary) hypertension: Secondary | ICD-10-CM

## 2021-04-27 DIAGNOSIS — E7800 Pure hypercholesterolemia, unspecified: Secondary | ICD-10-CM

## 2021-04-27 MED ORDER — DEXLANSOPRAZOLE 30 MG PO CPDR: 30.0000 mg | DELAYED_RELEASE_CAPSULE | Freq: Every day | ORAL | 1 refills | Status: DC

## 2021-04-27 MED ORDER — ATORVASTATIN CALCIUM 20 MG PO TABS: 20.0000 mg | ORAL_TABLET | Freq: Every day | ORAL | 1 refills | Status: DC

## 2021-04-27 MED ORDER — ATENOLOL 50 MG PO TABS: 50.0000 mg | ORAL_TABLET | Freq: Every day | ORAL | 1 refills | Status: DC

## 2021-04-27 NOTE — Telephone Encounter (Signed)
Patient requesting new rx for Dexlansoprazole 30 MG capsule atenolol (TENORMIN) 50 MG tablet atorvastatin (LIPITOR) 20 MG tablet  Patient states pharmacy will not fill medication due to her last provider being the prescribing provider  [facility] [phone] - [place], [place] - [address] AT [address] OF [facility] [address]

## 2021-08-24 ENCOUNTER — Other Ambulatory Visit: Payer: Self-pay | Admitting: Internal Medicine

## 2021-08-24 DIAGNOSIS — E559 Vitamin D deficiency, unspecified: Secondary | ICD-10-CM

## 2021-08-24 DIAGNOSIS — E7800 Pure hypercholesterolemia, unspecified: Secondary | ICD-10-CM

## 2021-10-07 ENCOUNTER — Other Ambulatory Visit: Payer: Self-pay | Admitting: Obstetrics & Gynecology

## 2021-10-07 DIAGNOSIS — Z1231 Encounter for screening mammogram for malignant neoplasm of breast: Secondary | ICD-10-CM

## 2021-10-14 ENCOUNTER — Ambulatory Visit
Admission: RE | Admit: 2021-10-14 | Discharge: 2021-10-14 | Disposition: A | Discharge status: Home / Self Care | Payer: 59 | Source: Ambulatory Visit | Attending: Obstetrics & Gynecology | Admitting: Obstetrics & Gynecology

## 2021-10-14 DIAGNOSIS — Z1231 Encounter for screening mammogram for malignant neoplasm of breast: Secondary | ICD-10-CM

## 2021-12-21 ENCOUNTER — Other Ambulatory Visit: Payer: Self-pay | Admitting: Internal Medicine

## 2021-12-21 DIAGNOSIS — I10 Essential (primary) hypertension: Secondary | ICD-10-CM

## 2022-01-05 ENCOUNTER — Telehealth: Payer: Self-pay | Admitting: Internal Medicine

## 2022-01-05 NOTE — Telephone Encounter (Signed)
Caller & Relationship to patient: Ahmani Daoud  Call back number: [phone]  Date of last office visit: 03/31/21  Date of next office visit: 02/14/22  Medication(s) to be refilled: amLODipine (NORVASC) 5 MG tablet  Preferred Pharmacy:  [id number] - [place], [place] - [address] AT [facility] [address] [facility] [address] Phone:  [phone]  Fax:  [phone]

## 2022-01-06 MED ORDER — AMLODIPINE BESYLATE 5 MG PO TABS: 5.0000 mg | ORAL_TABLET | Freq: Every day | ORAL | 0 refills | Status: DC

## 2022-01-06 NOTE — Telephone Encounter (Signed)
Pt due for f/u sent 30 day to pof.Marland KitchenRaechel Chute

## 2022-01-29 ENCOUNTER — Other Ambulatory Visit: Payer: Self-pay | Admitting: Internal Medicine

## 2022-02-12 ENCOUNTER — Other Ambulatory Visit: Payer: Self-pay | Admitting: Internal Medicine

## 2022-02-14 ENCOUNTER — Encounter: Payer: 59 | Admitting: Internal Medicine

## 2022-02-27 ENCOUNTER — Telehealth: Payer: Self-pay

## 2022-02-27 ENCOUNTER — Ambulatory Visit (INDEPENDENT_AMBULATORY_CARE_PROVIDER_SITE_OTHER): Payer: 59 | Admitting: Internal Medicine

## 2022-02-27 ENCOUNTER — Encounter: Payer: Self-pay | Admitting: Internal Medicine

## 2022-02-27 VITALS — BP 114/62 | HR 84 | Temp 98.1°F | Ht 62.25 in | Wt 237.0 lb

## 2022-02-27 DIAGNOSIS — E7800 Pure hypercholesterolemia, unspecified: Secondary | ICD-10-CM

## 2022-02-27 DIAGNOSIS — K219 Gastro-esophageal reflux disease without esophagitis: Secondary | ICD-10-CM

## 2022-02-27 DIAGNOSIS — M0579 Rheumatoid arthritis with rheumatoid factor of multiple sites without organ or systems involvement: Secondary | ICD-10-CM | POA: Diagnosis not present

## 2022-02-27 DIAGNOSIS — I10 Essential (primary) hypertension: Secondary | ICD-10-CM | POA: Diagnosis not present

## 2022-02-27 DIAGNOSIS — D6859 Other primary thrombophilia: Secondary | ICD-10-CM

## 2022-02-27 DIAGNOSIS — Z0001 Encounter for general adult medical examination with abnormal findings: Secondary | ICD-10-CM | POA: Diagnosis not present

## 2022-02-27 DIAGNOSIS — Z23 Encounter for immunization: Secondary | ICD-10-CM

## 2022-02-27 LAB — CBC WITH DIFFERENTIAL/PLATELET
Basophils Absolute: 0 10*3/uL (ref 0.0–0.1)
Basophils Relative: 0.2 % (ref 0.0–3.0)
Eosinophils Absolute: 0.1 10*3/uL (ref 0.0–0.7)
Eosinophils Relative: 2.2 % (ref 0.0–5.0)
HCT: 44.2 % (ref 36.0–46.0)
Hemoglobin: 14.5 g/dL (ref 12.0–15.0)
Lymphocytes Relative: 49.4 % — ABNORMAL HIGH (ref 12.0–46.0)
Lymphs Abs: 1.8 10*3/uL (ref 0.7–4.0)
MCHC: 32.9 g/dL (ref 30.0–36.0)
MCV: 92.6 fl (ref 78.0–100.0)
Monocytes Absolute: 0.5 10*3/uL (ref 0.1–1.0)
Monocytes Relative: 13.1 % — ABNORMAL HIGH (ref 3.0–12.0)
Neutro Abs: 1.3 10*3/uL — ABNORMAL LOW (ref 1.4–7.7)
Neutrophils Relative %: 35.1 % — ABNORMAL LOW (ref 43.0–77.0)
Platelets: 203 10*3/uL (ref 150.0–400.0)
RBC: 4.77 Mil/uL (ref 3.87–5.11)
RDW: 13.6 % (ref 11.5–15.5)
WBC: 3.7 10*3/uL — ABNORMAL LOW (ref 4.0–10.5)

## 2022-02-27 LAB — LIPID PANEL
Cholesterol: 257 mg/dL — ABNORMAL HIGH (ref 0–200)
HDL: 90.3 mg/dL (ref >39.00)
LDL Cholesterol: 140 mg/dL — ABNORMAL HIGH (ref 0–99)
NonHDL: 166.21
Total CHOL/HDL Ratio: 3
Triglycerides: 132 mg/dL (ref 0.0–149.0)
VLDL: 26.4 mg/dL (ref 0.0–40.0)

## 2022-02-27 LAB — BASIC METABOLIC PANEL
BUN: 14 mg/dL (ref 6–23)
CO2: 30 mEq/L (ref 19–32)
Calcium: 9.7 mg/dL (ref 8.4–10.5)
Chloride: 105 mEq/L (ref 96–112)
Creatinine, Ser: 0.96 mg/dL (ref 0.40–1.20)
GFR: 64.17 mL/min (ref >60.00)
Glucose, Bld: 101 mg/dL — ABNORMAL HIGH (ref 70–99)
Potassium: 4.9 mEq/L (ref 3.5–5.1)
Sodium: 143 mEq/L (ref 135–145)

## 2022-02-27 LAB — HEPATIC FUNCTION PANEL
ALT: 12 U/L (ref 0–35)
AST: 18 U/L (ref 0–37)
Albumin: 4 g/dL (ref 3.5–5.2)
Alkaline Phosphatase: 55 U/L (ref 39–117)
Bilirubin, Direct: 0.1 mg/dL (ref 0.0–0.3)
Total Bilirubin: 0.3 mg/dL (ref 0.2–1.2)
Total Protein: 7.6 g/dL (ref 6.0–8.3)

## 2022-02-27 LAB — URINALYSIS, ROUTINE W REFLEX MICROSCOPIC
Bilirubin Urine: NEGATIVE
Hgb urine dipstick: NEGATIVE
Nitrite: NEGATIVE
RBC / HPF: NONE SEEN (ref 0–?)
Specific Gravity, Urine: 1.025 (ref 1.000–1.030)
Total Protein, Urine: NEGATIVE
Urine Glucose: NEGATIVE
Urobilinogen, UA: 0.2 (ref 0.0–1.0)
pH: 5.5 (ref 5.0–8.0)

## 2022-02-27 LAB — TSH: TSH: 1.4 u[IU]/mL (ref 0.35–5.50)

## 2022-02-27 MED ORDER — ATENOLOL 50 MG PO TABS: 50.0000 mg | ORAL_TABLET | Freq: Every day | ORAL | 1 refills | Status: DC

## 2022-02-27 MED ORDER — ATORVASTATIN CALCIUM 20 MG PO TABS: 20.0000 mg | ORAL_TABLET | Freq: Every day | ORAL | 1 refills | Status: DC

## 2022-02-27 MED ORDER — RIVAROXABAN 10 MG PO TABS: 10.0000 mg | ORAL_TABLET | Freq: Every day | ORAL | 1 refills | Status: DC

## 2022-02-27 MED ORDER — DEXLANSOPRAZOLE 30 MG PO CPDR: 30.0000 mg | DELAYED_RELEASE_CAPSULE | Freq: Every day | ORAL | 1 refills | Status: DC

## 2022-02-27 MED ORDER — METHOCARBAMOL 500 MG PO TABS: 500.0000 mg | ORAL_TABLET | Freq: Two times a day (BID) | ORAL | 0 refills | Status: DC

## 2022-02-27 NOTE — Telephone Encounter (Signed)
Key: [id number]

## 2022-02-27 NOTE — Patient Instructions (Signed)

## 2022-02-27 NOTE — Progress Notes (Unsigned)
Subjective:  Patient ID: Angel Lee, female    DOB: [birth year]  Age: 61 y.o. MRN: [id number]  CC: Annual Exam, Hypertension, Hyperlipidemia, and Osteoarthritis   HPI Angel Lee presents for a CPX and f/up -  She walks 5000 steps per day. Her endurance is good and she denies DOE, CP, edema, palpitations.  Outpatient Medications Prior to Visit  Medication Sig Dispense Refill   celecoxib (CELEBREX) 200 MG capsule Take 1 capsule (200 mg total) by mouth daily. 90 capsule 2   Cholecalciferol (VITAMIN D3) 50 MCG (2000 UT) capsule TAKE 1 CAPSULE BY MOUTH EVERY DAY 100 capsule 1   HUMIRA PEN 40 MG/0.4ML PNKT SMARTSIG:40 Milligram(s) SUB-Q Every 2 Weeks     leflunomide (ARAVA) 20 MG tablet Take 20 mg by mouth daily.     triamcinolone cream (KENALOG) 0.5 % APPLY TO AFFECTED AREA TWICE A DAY 30 g 0   Vitamin D, Ergocalciferol, (DRISDOL) 1.25 MG (50000 UNIT) CAPS capsule Take 1 capsule (50,000 Units total) by mouth every 7 (seven) days. 12 capsule 0   amLODipine (NORVASC) 5 MG tablet TAKE 1 TABLET BY MOUTH DAILY. OVERDUE FOR FOLLOW-UP APPT MUST SEE PROVIDER FOR FUTURE REFILLS 30 tablet 0   atenolol (TENORMIN) 50 MG tablet Take 1 tablet (50 mg total) by mouth daily. 90 tablet 1   atorvastatin (LIPITOR) 20 MG tablet TAKE 1 TABLET BY MOUTH EVERY DAY 90 tablet 1   Dexlansoprazole 30 MG capsule DR Take 1 capsule (30 mg total) by mouth daily. 90 capsule 1   methocarbamol (ROBAXIN) 500 MG tablet TAKE 1 TABLET BY MOUTH TWICE A DAY 180 tablet 0   rivaroxaban (XARELTO) 10 MG TABS tablet Take 1 tablet (10 mg total) by mouth daily. 90 tablet 1   No facility-administered medications prior to visit.    ROS Review of Systems  Constitutional:  Negative for chills, diaphoresis, fatigue and fever.  HENT: Negative.    Eyes: Negative.   Respiratory:  Negative for cough, chest tightness, shortness of breath and wheezing.   Cardiovascular:  Negative for chest pain, palpitations and leg swelling.   Gastrointestinal:  Negative for abdominal pain, constipation, diarrhea, nausea and vomiting.  Endocrine: Negative.   Genitourinary: Negative.  Negative for difficulty urinating.  Musculoskeletal:  Positive for arthralgias. Negative for joint swelling and myalgias.  Allergic/Immunologic: Negative.   Neurological: Negative.  Negative for dizziness, weakness and light-headedness.  Hematological:  Negative for adenopathy. Does not bruise/bleed easily.  Psychiatric/Behavioral: Negative.      Objective:  BP 114/62 (BP Location: Right Arm, Patient Position: Sitting, Cuff Size: Large)   Pulse 84   Temp 98.1 F (36.7 C) (Oral)   Ht 5' 2.25" (1.581 m)   Wt 237 lb (107.5 kg)   SpO2 98%   BMI 43.00 kg/m   BP Readings from Last 3 Encounters:  02/27/22 114/62  03/31/21 138/88  10/21/20 108/82    Wt Readings from Last 3 Encounters:  02/27/22 237 lb (107.5 kg)  03/31/21 243 lb (110.2 kg)  10/21/20 248 lb (112.5 kg)    Physical Exam Vitals reviewed.  HENT:     Nose: Nose normal.     Mouth/Throat:     Mouth: Mucous membranes are moist.  Eyes:     General: No scleral icterus.    Conjunctiva/sclera: Conjunctivae normal.  Cardiovascular:     Rate and Rhythm: Normal rate and regular rhythm.     Heart sounds: Normal heart sounds, S1 normal and S2 normal. No murmur heard.  Comments: EKG- NSR, 70 bpm Diffuse flat/inverted T waves No LVH or Q waves Pulmonary:     Effort: Pulmonary effort is normal.     Breath sounds: No stridor. No wheezing, rhonchi or rales.  Abdominal:     General: Abdomen is protuberant. Bowel sounds are normal. There is no distension.     Palpations: Abdomen is soft. There is no hepatomegaly, splenomegaly or mass.     Tenderness: There is no abdominal tenderness. There is no guarding.  Musculoskeletal:        General: No swelling.     Cervical back: Neck supple.     Right lower leg: No edema.     Left lower leg: No edema.  Skin:    General: Skin is warm  and dry.  Neurological:     General: No focal deficit present.     Mental Status: She is alert.  Psychiatric:        Mood and Affect: Mood normal.        Behavior: Behavior normal.     Lab Results  Component Value Date   WBC 3.7 (L) 02/27/2022   HGB 14.5 02/27/2022   HCT 44.2 02/27/2022   PLT 203.0 02/27/2022   GLUCOSE 101 (H) 02/27/2022   CHOL 257 (H) 02/27/2022   TRIG 132.0 02/27/2022   HDL 90.30 02/27/2022   LDLCALC 140 (H) 02/27/2022   ALT 12 02/27/2022   AST 18 02/27/2022   NA 143 02/27/2022   K 4.9 02/27/2022   CL 105 02/27/2022   CREATININE 0.96 02/27/2022   BUN 14 02/27/2022   CO2 30 02/27/2022   TSH 1.40 02/27/2022   HGBA1C 5.6 03/31/2021    MM 3D SCREEN BREAST BILATERAL  Result Date: 10/17/2021 CLINICAL DATA:  Screening. EXAM: DIGITAL SCREENING BILATERAL MAMMOGRAM WITH TOMOSYNTHESIS AND CAD TECHNIQUE: Bilateral screening digital craniocaudal and mediolateral oblique mammograms were obtained. Bilateral screening digital breast tomosynthesis was performed. The images were evaluated with computer-aided detection. COMPARISON:  Previous exam(s). ACR Breast Density Category b: There are scattered areas of fibroglandular density. FINDINGS: There are no findings suspicious for malignancy. IMPRESSION: No mammographic evidence of malignancy. A result letter of this screening mammogram will be mailed directly to the patient. RECOMMENDATION: Screening mammogram in one year. (Code:SM-B-01Y) BI-RADS CATEGORY  1: Negative. Electronically Signed   By: Nolon Nations M.D.   On: 10/17/2021 12:53    Assessment & Plan:   Angel Lee was seen today for annual exam, hypertension, hyperlipidemia and osteoarthritis.  Diagnoses and all orders for this visit:  Encounter for general adult medical examination with abnormal findings  Primary hypertension -     atenolol (TENORMIN) 50 MG tablet; Take 1 tablet (50 mg total) by mouth daily. -     EKG 12-Lead -     Basic metabolic panel;  Future -     Urinalysis, Routine w reflex microscopic; Future -     Hepatic function panel; Future -     CBC with Differential/Platelet; Future -     Basic metabolic panel -     Urinalysis, Routine w reflex microscopic -     Hepatic function panel -     CBC with Differential/Platelet -     amLODipine (NORVASC) 5 MG tablet; Take 1 tablet (5 mg total) by mouth daily.  Pure hypercholesterolemia -     atorvastatin (LIPITOR) 20 MG tablet; Take 1 tablet (20 mg total) by mouth daily. -     Lipid panel; Future -  TSH; Future -     Hepatic function panel; Future -     Lipid panel -     TSH -     Hepatic function panel  Gastroesophageal reflux disease without esophagitis -     Dexlansoprazole 30 MG capsule DR; Take 1 capsule (30 mg total) by mouth daily. -     CBC with Differential/Platelet; Future -     CBC with Differential/Platelet  Rheumatoid arthritis involving multiple sites with positive rheumatoid factor (HCC) -     methocarbamol (ROBAXIN) 500 MG tablet; Take 1 tablet (500 mg total) by mouth 2 (two) times daily.  Hypercoagulable state, primary (HCC) -     rivaroxaban (XARELTO) 10 MG TABS tablet; Take 1 tablet (10 mg total) by mouth daily. -     Basic metabolic panel; Future -     Basic metabolic panel  Other orders -     Flu Vaccine QUAD 6+ mos PF IM (Fluarix Quad PF)   I have changed Madiha V. Busta's atorvastatin, methocarbamol, and amLODipine. I am also having her maintain her celecoxib, Humira Pen, Vitamin D (Ergocalciferol), leflunomide, triamcinolone cream, Vitamin D3, atenolol, Dexlansoprazole, and rivaroxaban.  Meds ordered this encounter  Medications   atenolol (TENORMIN) 50 MG tablet    Sig: Take 1 tablet (50 mg total) by mouth daily.    Dispense:  90 tablet    Refill:  1   atorvastatin (LIPITOR) 20 MG tablet    Sig: Take 1 tablet (20 mg total) by mouth daily.    Dispense:  90 tablet    Refill:  1   Dexlansoprazole 30 MG capsule DR    Sig: Take 1 capsule  (30 mg total) by mouth daily.    Dispense:  90 capsule    Refill:  1   methocarbamol (ROBAXIN) 500 MG tablet    Sig: Take 1 tablet (500 mg total) by mouth 2 (two) times daily.    Dispense:  180 tablet    Refill:  0    DX Code Needed  .   rivaroxaban (XARELTO) 10 MG TABS tablet    Sig: Take 1 tablet (10 mg total) by mouth daily.    Dispense:  90 tablet    Refill:  1   amLODipine (NORVASC) 5 MG tablet    Sig: Take 1 tablet (5 mg total) by mouth daily.    Dispense:  90 tablet    Refill:  1     Follow-up: Return in about 6 months (around 08/29/2022).  Sanda Linger, MD

## 2022-02-28 ENCOUNTER — Telehealth: Payer: Self-pay | Admitting: Internal Medicine

## 2022-02-28 ENCOUNTER — Other Ambulatory Visit: Payer: Self-pay | Admitting: Internal Medicine

## 2022-02-28 ENCOUNTER — Other Ambulatory Visit: Payer: Self-pay | Admitting: Orthopedic Surgery

## 2022-02-28 MED ORDER — AMLODIPINE BESYLATE 5 MG PO TABS: 5.0000 mg | ORAL_TABLET | Freq: Every day | ORAL | 1 refills | Status: DC

## 2022-02-28 NOTE — Telephone Encounter (Signed)
For our records:  We have received surgical clearance PW for the pt from the hand center. The forms have been placed in both of Jone's boxes.

## 2022-03-01 MED ORDER — ESOMEPRAZOLE MAGNESIUM 40 MG PO CPDR: 40.0000 mg | DELAYED_RELEASE_CAPSULE | Freq: Every day | ORAL | 1 refills | Status: DC

## 2022-03-01 NOTE — Telephone Encounter (Signed)
Per [facility] [employer] PA was denied.  Reason given:  The policy states that this medication may be approved when: -The member is unable to take the required number of formulary alternatives for the given diagnosis due to an intolerance or contraindication OR -The member has tried and failed the required number of formulary alternatives.  Formulary alternatives are: esomeprazole delayed-rel, lansoprazole delayed-rel capsule, omeprazole delayed-rel, pantoprazole delayed-rel tablet. (Requirement: 3 in a class with 3 or more alternatives, 2 in a class with 2 alternatives, or 1 in a class with only 1 alternative.)  Determination sent to scan

## 2022-03-03 ENCOUNTER — Other Ambulatory Visit: Payer: Self-pay | Admitting: Internal Medicine

## 2022-03-03 DIAGNOSIS — E559 Vitamin D deficiency, unspecified: Secondary | ICD-10-CM

## 2022-03-20 ENCOUNTER — Encounter (HOSPITAL_BASED_OUTPATIENT_CLINIC_OR_DEPARTMENT_OTHER): Payer: Self-pay | Admitting: Orthopedic Surgery

## 2022-03-28 ENCOUNTER — Encounter (HOSPITAL_BASED_OUTPATIENT_CLINIC_OR_DEPARTMENT_OTHER)
Admission: RE | Disposition: A | Discharge status: Home / Self Care | Payer: Self-pay | Source: Home / Self Care | Attending: Orthopedic Surgery

## 2022-03-28 ENCOUNTER — Ambulatory Visit (HOSPITAL_BASED_OUTPATIENT_CLINIC_OR_DEPARTMENT_OTHER): Payer: 59 | Admitting: Anesthesiology

## 2022-03-28 ENCOUNTER — Ambulatory Visit (HOSPITAL_BASED_OUTPATIENT_CLINIC_OR_DEPARTMENT_OTHER)
Admission: RE | Admit: 2022-03-28 | Discharge: 2022-03-28 | Disposition: A | Discharge status: Home / Self Care | Payer: 59 | Attending: Orthopedic Surgery | Admitting: Orthopedic Surgery

## 2022-03-28 ENCOUNTER — Encounter (HOSPITAL_BASED_OUTPATIENT_CLINIC_OR_DEPARTMENT_OTHER): Payer: Self-pay | Admitting: Orthopedic Surgery

## 2022-03-28 DIAGNOSIS — Z6841 Body Mass Index (BMI) 40.0 and over, adult: Secondary | ICD-10-CM

## 2022-03-28 DIAGNOSIS — I10 Essential (primary) hypertension: Secondary | ICD-10-CM | POA: Insufficient documentation

## 2022-03-28 DIAGNOSIS — M65331 Trigger finger, right middle finger: Secondary | ICD-10-CM

## 2022-03-28 DIAGNOSIS — M069 Rheumatoid arthritis, unspecified: Secondary | ICD-10-CM | POA: Insufficient documentation

## 2022-03-28 DIAGNOSIS — Z01818 Encounter for other preprocedural examination: Secondary | ICD-10-CM

## 2022-03-28 HISTORY — PX: TRIGGER FINGER RELEASE: SHX641

## 2022-03-28 HISTORY — PX: FLEXOR TENDON REPAIR: SHX6501

## 2022-03-28 SURGERY — RELEASE, A1 PULLEY, FOR TRIGGER FINGER
Anesthesia: General | Site: Hand | Laterality: Right

## 2022-03-28 MED ORDER — 0.9 % SODIUM CHLORIDE (POUR BTL) OPTIME
TOPICAL | Status: DC | PRN
  Administered 2022-03-28: 75 mL

## 2022-03-28 MED ORDER — TRAMADOL HCL 50 MG PO TABS: ORAL_TABLET | ORAL | 0 refills | Status: DC

## 2022-03-28 MED ORDER — BUPIVACAINE HCL (PF) 0.25 % IJ SOLN
INTRAMUSCULAR | Status: DC | PRN
  Administered 2022-03-28: 9 mL

## 2022-03-28 MED ORDER — ROPIVACAINE HCL 5 MG/ML IJ SOLN
INTRAMUSCULAR | Status: DC | PRN
  Administered 2022-03-28: 10 mL

## 2022-03-28 MED ORDER — PROPOFOL 10 MG/ML IV BOLUS
INTRAVENOUS | Status: DC | PRN
  Administered 2022-03-28: 200 mg via INTRAVENOUS

## 2022-03-28 MED ORDER — ONDANSETRON HCL 4 MG/2ML IJ SOLN: 4.0000 mg | Freq: Once | INTRAMUSCULAR | Status: DC | PRN

## 2022-03-28 MED ORDER — OXYCODONE HCL 5 MG/5ML PO SOLN: 5.0000 mg | Freq: Once | ORAL | Status: DC | PRN

## 2022-03-28 MED ORDER — FENTANYL CITRATE (PF) 100 MCG/2ML IJ SOLN
50.0000 ug | Freq: Once | INTRAMUSCULAR | Status: AC
  Administered 2022-03-28: 50 ug via INTRAVENOUS

## 2022-03-28 MED ORDER — MIDAZOLAM HCL 2 MG/2ML IJ SOLN
2.0000 mg | Freq: Once | INTRAMUSCULAR | Status: AC
  Administered 2022-03-28: 2 mg via INTRAVENOUS

## 2022-03-28 MED ORDER — PROPOFOL 10 MG/ML IV BOLUS
INTRAVENOUS | Status: AC
  Filled 2022-03-28: qty 20

## 2022-03-28 MED ORDER — OXYCODONE HCL 5 MG PO TABS: 5.0000 mg | ORAL_TABLET | Freq: Once | ORAL | Status: DC | PRN

## 2022-03-28 MED ORDER — DEXAMETHASONE SODIUM PHOSPHATE 10 MG/ML IJ SOLN
INTRAMUSCULAR | Status: AC
  Filled 2022-03-28: qty 1

## 2022-03-28 MED ORDER — FENTANYL CITRATE (PF) 100 MCG/2ML IJ SOLN
INTRAMUSCULAR | Status: AC
  Filled 2022-03-28: qty 2

## 2022-03-28 MED ORDER — MIDAZOLAM HCL 2 MG/2ML IJ SOLN
INTRAMUSCULAR | Status: AC
  Filled 2022-03-28: qty 2

## 2022-03-28 MED ORDER — DEXAMETHASONE SODIUM PHOSPHATE 10 MG/ML IJ SOLN
INTRAMUSCULAR | Status: DC | PRN
  Administered 2022-03-28: 4 mg via INTRAVENOUS

## 2022-03-28 MED ORDER — CEFAZOLIN SODIUM-DEXTROSE 2-4 GM/100ML-% IV SOLN
2.0000 g | INTRAVENOUS | Status: AC
  Administered 2022-03-28: 2 g via INTRAVENOUS

## 2022-03-28 MED ORDER — BUPIVACAINE HCL (PF) 0.25 % IJ SOLN
INTRAMUSCULAR | Status: AC
  Filled 2022-03-28: qty 60

## 2022-03-28 MED ORDER — LIDOCAINE 2% (20 MG/ML) 5 ML SYRINGE
INTRAMUSCULAR | Status: AC
  Filled 2022-03-28: qty 5

## 2022-03-28 MED ORDER — FENTANYL CITRATE (PF) 100 MCG/2ML IJ SOLN
INTRAMUSCULAR | Status: DC | PRN
  Administered 2022-03-28: 50 ug via INTRAVENOUS
  Administered 2022-03-28: 25 ug via INTRAVENOUS

## 2022-03-28 MED ORDER — FENTANYL CITRATE (PF) 100 MCG/2ML IJ SOLN: 25.0000 ug | INTRAMUSCULAR | Status: DC | PRN

## 2022-03-28 MED ORDER — LACTATED RINGERS IV SOLN: INTRAVENOUS | Status: DC

## 2022-03-28 MED ORDER — KETOROLAC TROMETHAMINE 30 MG/ML IJ SOLN: 30.0000 mg | Freq: Once | INTRAMUSCULAR | Status: DC | PRN

## 2022-03-28 MED ORDER — ONDANSETRON HCL 4 MG/2ML IJ SOLN
INTRAMUSCULAR | Status: AC
  Filled 2022-03-28: qty 2

## 2022-03-28 MED ORDER — LIDOCAINE HCL (CARDIAC) PF 100 MG/5ML IV SOSY
PREFILLED_SYRINGE | INTRAVENOUS | Status: DC | PRN
  Administered 2022-03-28: 100 mg via INTRAVENOUS

## 2022-03-28 MED ORDER — CEFAZOLIN SODIUM-DEXTROSE 2-4 GM/100ML-% IV SOLN
INTRAVENOUS | Status: AC
  Filled 2022-03-28: qty 100

## 2022-03-28 MED ORDER — ONDANSETRON HCL 4 MG/2ML IJ SOLN
INTRAMUSCULAR | Status: DC | PRN
  Administered 2022-03-28: 4 mg via INTRAVENOUS

## 2022-03-28 SURGICAL SUPPLY — 83 items
APL PRP STRL LF DISP 70% ISPRP (MISCELLANEOUS) ×2
BAG DECANTER FOR FLEXI CONT (MISCELLANEOUS) IMPLANT
BALL CTTN LRG ABS STRL LF (GAUZE/BANDAGES/DRESSINGS)
BLADE MINI RND TIP GREEN BEAV (BLADE) IMPLANT
BLADE SURG 15 STRL LF DISP TIS (BLADE) ×4 IMPLANT
BLADE SURG 15 STRL SS (BLADE) ×4
BNDG CMPR 5X2 CHSV 1 LYR STRL (GAUZE/BANDAGES/DRESSINGS) ×2
BNDG CMPR 75X21 PLY HI ABS (MISCELLANEOUS)
BNDG CMPR 9X4 STRL LF SNTH (GAUZE/BANDAGES/DRESSINGS) ×2
BNDG COHESIVE 2X5 TAN ST LF (GAUZE/BANDAGES/DRESSINGS) ×2 IMPLANT
BNDG ELASTIC 2X5.8 VLCR STR LF (GAUZE/BANDAGES/DRESSINGS) IMPLANT
BNDG ELASTIC 3X5.8 VLCR STR LF (GAUZE/BANDAGES/DRESSINGS) ×2 IMPLANT
BNDG ESMARK 4X9 LF (GAUZE/BANDAGES/DRESSINGS) ×2 IMPLANT
BNDG GAUZE DERMACEA FLUFF 4 (GAUZE/BANDAGES/DRESSINGS) IMPLANT
BNDG GZE DERMACEA 4 6PLY (GAUZE/BANDAGES/DRESSINGS)
CATH ROBINSON RED A/P 10FR (CATHETERS) IMPLANT
CHLORAPREP W/TINT 26 (MISCELLANEOUS) ×2 IMPLANT
CORD BIPOLAR FORCEPS 12FT (ELECTRODE) ×2 IMPLANT
COTTONBALL LRG STERILE PKG (GAUZE/BANDAGES/DRESSINGS) IMPLANT
COVER BACK TABLE 60X90IN (DRAPES) ×2 IMPLANT
COVER MAYO STAND STRL (DRAPES) ×2 IMPLANT
CUFF TOURN SGL QUICK 18X4 (TOURNIQUET CUFF) ×2 IMPLANT
CUFF TOURN SGL QUICK 24 (TOURNIQUET CUFF) ×2
CUFF TRNQT CYL 24X4X40X1 (TOURNIQUET CUFF) IMPLANT
DRAPE EXTREMITY T 121X128X90 (DISPOSABLE) ×2 IMPLANT
DRAPE OEC MINIVIEW 54X84 (DRAPES) IMPLANT
DRAPE SURG 17X23 STRL (DRAPES) ×2 IMPLANT
GAUZE 4X4 16PLY ~~LOC~~+RFID DBL (SPONGE) IMPLANT
GAUZE PAD ABD 8X10 STRL (GAUZE/BANDAGES/DRESSINGS) IMPLANT
GAUZE SPONGE 4X4 12PLY STRL (GAUZE/BANDAGES/DRESSINGS) ×2 IMPLANT
GAUZE STRETCH 2X75IN STRL (MISCELLANEOUS) IMPLANT
GAUZE XEROFORM 1X8 LF (GAUZE/BANDAGES/DRESSINGS) ×2 IMPLANT
GLOVE BIO SURGEON STRL SZ7.5 (GLOVE) ×2 IMPLANT
GLOVE BIOGEL PI IND STRL 7.0 (GLOVE) IMPLANT
GLOVE BIOGEL PI IND STRL 7.5 (GLOVE) IMPLANT
GLOVE BIOGEL PI IND STRL 8 (GLOVE) ×2 IMPLANT
GLOVE BIOGEL PI IND STRL 8.5 (GLOVE) IMPLANT
GLOVE SURG ORTHO 8.0 STRL STRW (GLOVE) IMPLANT
GOWN STRL REUS W/ TWL LRG LVL3 (GOWN DISPOSABLE) ×2 IMPLANT
GOWN STRL REUS W/ TWL XL LVL3 (GOWN DISPOSABLE) IMPLANT
GOWN STRL REUS W/TWL LRG LVL3 (GOWN DISPOSABLE)
GOWN STRL REUS W/TWL XL LVL3 (GOWN DISPOSABLE) ×4 IMPLANT
K-WIRE DBL .035X4 NSTRL (WIRE)
KWIRE DBL .035X4 NSTRL (WIRE) IMPLANT
LOOP VESSEL MAXI BLUE (MISCELLANEOUS) IMPLANT
NDL HYPO 25X1 1.5 SAFETY (NEEDLE) ×2 IMPLANT
NDL KEITH (NEEDLE) IMPLANT
NEEDLE HYPO 25X1 1.5 SAFETY (NEEDLE) ×2 IMPLANT
NEEDLE KEITH (NEEDLE) IMPLANT
NS IRRIG 1000ML POUR BTL (IV SOLUTION) ×2 IMPLANT
PACK BASIN DAY SURGERY FS (CUSTOM PROCEDURE TRAY) ×2 IMPLANT
PAD CAST 3X4 CTTN HI CHSV (CAST SUPPLIES) ×2 IMPLANT
PAD CAST 4YDX4 CTTN HI CHSV (CAST SUPPLIES) IMPLANT
PADDING CAST ABS COTTON 3X4 (CAST SUPPLIES) IMPLANT
PADDING CAST ABS COTTON 4X4 ST (CAST SUPPLIES) ×2 IMPLANT
PADDING CAST COTTON 3X4 STRL (CAST SUPPLIES) ×2
PADDING CAST COTTON 4X4 STRL (CAST SUPPLIES)
SLEEVE SCD COMPRESS KNEE MED (STOCKING) IMPLANT
SPIKE FLUID TRANSFER (MISCELLANEOUS) IMPLANT
SPLINT PLASTER CAST XFAST 3X15 (CAST SUPPLIES) IMPLANT
SPLINT PLASTER CAST XFAST 4X15 (CAST SUPPLIES) IMPLANT
STOCKINETTE 4X48 STRL (DRAPES) ×2 IMPLANT
SUT CHROMIC 5 0 P 3 (SUTURE) IMPLANT
SUT ETHIBOND 3-0 V-5 (SUTURE) IMPLANT
SUT ETHILON 3 0 PS 1 (SUTURE) IMPLANT
SUT ETHILON 4 0 PS 2 18 (SUTURE) ×2 IMPLANT
SUT FIBERWIRE 4-0 18 DIAM BLUE (SUTURE)
SUT MERSILENE 2.0 SH NDLE (SUTURE) IMPLANT
SUT MERSILENE 4 0 P 3 (SUTURE) IMPLANT
SUT PROLENE 2 0 SH DA (SUTURE) IMPLANT
SUT PROLENE 5 0 P 3 (SUTURE) IMPLANT
SUT SILK 4 0 PS 2 (SUTURE) IMPLANT
SUT SUPRAMID 4-0 (SUTURE) IMPLANT
SUT VIC AB 4-0 P-3 18XBRD (SUTURE) IMPLANT
SUT VIC AB 4-0 P3 18 (SUTURE)
SUT VICRYL 4-0 PS2 18IN ABS (SUTURE) IMPLANT
SUTURE FIBERWR 4-0 18 DIA BLUE (SUTURE) IMPLANT
SYR BULB EAR ULCER 3OZ GRN STR (SYRINGE) ×2 IMPLANT
SYR CONTROL 10ML LL (SYRINGE) ×2 IMPLANT
SYR TOOMEY 50ML (SYRINGE) IMPLANT
TOWEL GREEN STERILE FF (TOWEL DISPOSABLE) ×4 IMPLANT
TUBE FEEDING ENTERAL 5FR 16IN (TUBING) IMPLANT
UNDERPAD 30X36 HEAVY ABSORB (UNDERPADS AND DIAPERS) ×2 IMPLANT

## 2022-03-28 NOTE — Op Note (Signed)
I assisted Surgeon(s) and Role:    * [name], MD - Primary    [name], MD - Assisting on the Procedure(s): RIGHT LONG FINGER TRIGGER RELEASE POSSIBLE PARTIAL FLEXOR DIGITORUM SUPERFICIALIS EXCISION on 03/28/2022.  I provided assistance on this case as follows: Set up, approach, identification of the partial rupture superficialis tendon, neurovascular bundle protection, tenosynovectomy and resection of ulnar slip superficialis tendon, [place] of the wound and application of the dressing.  Electronically signed by: [name], MD Date: 03/28/2022 Time: 2:47 PM

## 2022-03-28 NOTE — Anesthesia Postprocedure Evaluation (Signed)
Anesthesia Post Note  Patient: Angel Lee  Procedure(s) Performed: RIGHT LONG FINGER TRIGGER RELEASE (Right: Finger) RIGHT LONG FINGER EXCISION OF ULNAR SLIP OF FDS (Right: Hand)     Patient location during evaluation: PACU Anesthesia Type: General Level of consciousness: awake and alert Pain management: pain level controlled Vital Signs Assessment: post-procedure vital signs reviewed and stable Respiratory status: spontaneous breathing, nonlabored ventilation, respiratory function stable and patient connected to nasal cannula oxygen Cardiovascular status: blood pressure returned to baseline and stable Postop Assessment: no apparent nausea or vomiting Anesthetic complications: no   No notable events documented.  Last Vitals:  Vitals:   03/28/22 1500 03/28/22 1514  BP: 139/82 136/83  Pulse: 61 66  Resp: 16 18  Temp:    SpO2: 94% 94%    Last Pain:  Vitals:   03/28/22 1514  TempSrc:   PainSc: 0-No pain                 [name]

## 2022-03-28 NOTE — Discharge Instructions (Addendum)

## 2022-03-28 NOTE — Anesthesia Procedure Notes (Signed)
Procedure Name: LMA Insertion Date/Time: 03/28/2022 2:03 PM  Performed by: Maryella Shivers, CRNAPre-anesthesia Checklist: Patient identified, Emergency Drugs available, Suction available and Patient being monitored Patient Re-evaluated:Patient Re-evaluated prior to induction Oxygen Delivery Method: Circle system utilized Preoxygenation: Pre-oxygenation with 100% oxygen Induction Type: IV induction Ventilation: Mask ventilation without difficulty LMA: LMA inserted LMA Size: 4.0 Number of attempts: 1 Airway Equipment and Method: Bite block Placement Confirmation: positive ETCO2 Tube secured with: Tape Dental Injury: Teeth and Oropharynx as per pre-operative assessment

## 2022-03-28 NOTE — Anesthesia Procedure Notes (Signed)
Anesthesia Regional Block: Wrist block   Pre-Anesthetic Checklist: , timeout performed,  Correct Patient, Correct Site, Correct Laterality,  Correct Procedure, Correct Position, site marked,  Risks and benefits discussed,  Surgical consent,  Pre-op evaluation,  At surgeon's request and post-op pain management  Laterality: Right  Prep: chloraprep       Needles:  Injection technique: Single-shot  Needle Type: Other     Needle Length: 2cm  Needle Gauge: 25     Additional Needles:   Narrative:  Start time: 03/28/2022 12:52 PM End time: 03/28/2022 12:55 PM Injection made incrementally with aspirations every 5 mL.  Performed by: Personally  Anesthesiologist: Myrtie Soman, MD  Additional Notes: Patient tolerated the procedure well without complications

## 2022-03-28 NOTE — Transfer of Care (Signed)
Immediate Anesthesia Transfer of Care Note  Patient: Angel Lee  Procedure(s) Performed: RIGHT LONG FINGER TRIGGER RELEASE (Right) POSSIBLE PARTIAL FLEXOR DIGITORUM SUPERFICIALIS EXCISION (Right)  Patient Location: PACU  Anesthesia Type:General  Level of Consciousness: awake, alert  and oriented  Airway & Oxygen Therapy: Patient Spontanous Breathing and Patient connected to face mask oxygen  Post-op Assessment: Report given to RN and Post -op Vital signs reviewed and stable  Post vital signs: Reviewed and stable  Last Vitals:  Vitals Value Taken Time  BP 135/82 03/28/22 1453  Temp    Pulse 75 03/28/22 1454  Resp 14 03/28/22 1454  SpO2 99 % 03/28/22 1454  Vitals shown include unvalidated device data.  Last Pain:  Vitals:   03/28/22 1137  TempSrc: Oral  PainSc: 0-No pain         Complications: No notable events documented.

## 2022-03-28 NOTE — Progress Notes (Signed)
Assisted Dr. Kalman Shan with right digital wrist block. Side rails up, monitors on throughout procedure. See vital signs in flow sheet. Tolerated Procedure well.

## 2022-03-28 NOTE — Op Note (Signed)
NAME: Angel Lee MEDICAL RECORD NO: [id number] DATE OF BIRTH: [birth year] FACILITY: [facility] LOCATION: [facility] PHYSICIAN: Angel Must, MD   OPERATIVE REPORT   DATE OF PROCEDURE: 03/28/22    PREOPERATIVE DIAGNOSIS: Right long finger trigger digit   POSTOPERATIVE DIAGNOSIS: Right long finger trigger digit   PROCEDURE: 1.  Right long finger flexor Tina synovectomy 2.  Right long finger excision of ulnar slip of FDS   SURGEON:  Angel Lee, M.D.   ASSISTANT: Angel Brod, MD   ANESTHESIA:  General   INTRAVENOUS FLUIDS:  Per anesthesia flow sheet.   ESTIMATED BLOOD LOSS:  Minimal.   COMPLICATIONS:  None.   SPECIMENS:  none   TOURNIQUET TIME:    Total Tourniquet Time Documented: Upper Arm (Right) - 27 minutes Total: Upper Arm (Right) - 27 minutes    DISPOSITION:  Stable to PACU.   INDICATIONS: 61 year old female with triggering of the right long finger.  This been injected previously without lasting resolution.  She has rheumatoid arthritis.  He wishes to proceed with right long finger trigger release with possible excision of the FDS slip.  Risks, benefits and alternatives of surgery were discussed including the risks of blood loss, infection, damage to nerves, vessels, tendons, ligaments, bone for surgery, need for additional surgery, complications with wound healing, continued pain, stiffness, , recurrence.  She voiced understanding of these risks and elected to proceed.  OPERATIVE COURSE:  After being identified preoperatively by myself,  the patient and I agreed on the procedure and site of the procedure.  The surgical site was marked.  Surgical consent had been signed. Preoperative IV antibiotic prophylaxis was given. She was transferred to the operating room and placed on the operating table in supine position with the right upper extremity on an arm board.  General anesthesia was induced by the anesthesiologist.  Right upper extremity was prepped  and draped in normal sterile orthopedic fashion.  A surgical pause was performed between the surgeons, anesthesia, and operating room staff and all were in agreement as to the patient, procedure, and site of procedure.  Tourniquet at the proximal aspect of the extremity was inflated to 250 mmHg after exsanguination of the arm with an Esmarch bandage.  Incision was made over the A1 pulley and carried in subcutaneous tissues by spreading technique.  Bipolar electrocautery is used to obtain hemostasis.  The flexor tendons were identified proximal to the A1 pulley.  The incision was extended proximally to aid in visualization.  There was tenosynovitis surrounding the tendons.  There was some fraying of the FDS tendon at the split.  This was debrided sharply with the scissors to remove frayed tissue.  The incision was extended distally in a Bruner fashion over the proximal phalanx.  The tendon was visualized distal to the A2 pulley.  The tendon was intact.  There was some tenosynovitis here as well.  This was sharply removed with the scissors.  When placing traction on the FDP or FDS tendon proximally the finger would trigger.  This appear to be coming from underneath the flexor sheath over the proximal phalanx.  The ulnar slip of FDS was released at the proximal aspect.  It was then able to be pulled through the sheath and the ulnar slip isolated distally.  It was then excised.  A finger was placed through range of motion and traction on the FDP and FDS tendons from proximally did not elicit triggering.  The wound was copiously irrigated with sterile  saline.  It was closed with 4-0 nylon in a horizontal mattress fashion.  It was injected with quarter percent plain Marcaine to aid in postoperative analgesia.  It was dressed with sterile Xeroform 4 x 4's and wrapped with a Coban dressing lightly.  The tourniquet was deflated at 27 minutes.  Fingertips were pink with brisk capillary refill after deflation of tourniquet.   The operative  drapes were broken down.  The patient was awoken from anesthesia safely.  She was transferred back to the stretcher and taken to PACU in stable condition.  I will see her back in the office in 1 week for postoperative followup.  I will give her a prescription for Tramadol 50 mg 1-2 tabs PO q6 hours prn pain, dispense # 20.   Angel Loa, MD Electronically signed, 03/28/22

## 2022-03-28 NOTE — Anesthesia Preprocedure Evaluation (Signed)
Anesthesia Evaluation  Patient identified by MRN, date of birth, ID band Patient awake    Reviewed: Allergy & Precautions, NPO status , Patient's Chart, lab work & pertinent test results  Airway Mallampati: III  TM Distance: <3 FB Neck ROM: Full    Dental no notable dental hx.    Pulmonary neg pulmonary ROS,    Pulmonary exam normal breath sounds clear to auscultation       Cardiovascular hypertension, Normal cardiovascular exam Rhythm:Regular Rate:Normal     Neuro/Psych negative neurological ROS  negative psych ROS   GI/Hepatic negative GI ROS, Neg liver ROS,   Endo/Other  Morbid obesity  Renal/GU negative Renal ROS  negative genitourinary   Musculoskeletal  (+) Arthritis , Rheumatoid disorders,    Abdominal   Peds negative pediatric ROS (+)  Hematology negative hematology ROS (+)   Anesthesia Other Findings   Reproductive/Obstetrics negative OB ROS                             Anesthesia Physical Anesthesia Plan  ASA: 3  Anesthesia Plan: General   Post-op Pain Management: Regional block*   Induction: Intravenous  PONV Risk Score and Plan: 3 and Ondansetron, Dexamethasone and Treatment may vary due to age or medical condition  Airway Management Planned: LMA  Additional Equipment:   Intra-op Plan:   Post-operative Plan: Extubation in OR  Informed Consent: I have reviewed the patients History and Physical, chart, labs and discussed the procedure including the risks, benefits and alternatives for the proposed anesthesia with the patient or authorized representative who has indicated his/her understanding and acceptance.     Dental advisory given  Plan Discussed with: CRNA and Surgeon  Anesthesia Plan Comments:         Anesthesia Quick Evaluation

## 2022-03-28 NOTE — H&P (Signed)
Angel Lee is an 61 y.o. female.   Chief Complaint: trigger digit HPI: 61 yo female with triggering right long finger.  Has had one injection without lasting relief.  She wishes to have surgical trigger release with possible partial fds excision.  Allergies:  Allergies  Allergen Reactions   Erythromycin Nausea And Vomiting   Sulfa Antibiotics Hives   Typhoid Vaccines Other (See Comments)    Pain, inflammation     Past Medical History:  Diagnosis Date   Allergy    Chicken pox    DVT (deep venous thrombosis) (HCC) 05/29/2009   GERD (gastroesophageal reflux disease)    Hyperlipidemia    Hypertension    PE (pulmonary embolism) 05/29/2009   Rheumatoid arthritis (HCC)     Past Surgical History:  Procedure Laterality Date   FOOT SURGERY Right 2003   KNEE ARTHROSCOPY Bilateral 1981, 1998, 2007   ROTATOR CUFF REPAIR Bilateral 2000, 2005   wisdom teeth      Family History: Family History  Problem Relation Age of Onset   Arthritis Mother    Hyperlipidemia Mother    Hyperlipidemia Father    Heart disease Father    Stroke Father    Diabetes Father    Hypertension Brother    Deep vein thrombosis Brother    Hyperlipidemia Maternal Grandmother    Hyperlipidemia Maternal Grandfather    Heart disease Maternal Grandfather    Stroke Maternal Grandfather    Hypertension Maternal Grandfather    Hyperlipidemia Paternal Grandmother    Diabetes Paternal Grandmother    Hyperlipidemia Paternal Grandfather    Prostate cancer Maternal Uncle    Heart disease Paternal Aunt    Stroke Paternal Aunt    Stroke Paternal Uncle     Social History:   reports that she has never smoked. She has never used smokeless tobacco. She reports that she does not drink alcohol and does not use drugs.  Medications: Medications Prior to Admission  Medication Sig Dispense Refill   amLODipine (NORVASC) 5 MG tablet Take 1 tablet (5 mg total) by mouth daily. 90 tablet 1   atenolol (TENORMIN) 50 MG  tablet Take 1 tablet (50 mg total) by mouth daily. 90 tablet 1   atorvastatin (LIPITOR) 20 MG tablet Take 1 tablet (20 mg total) by mouth daily. 90 tablet 1   celecoxib (CELEBREX) 200 MG capsule Take 1 capsule (200 mg total) by mouth daily. 90 capsule 2   Cholecalciferol (VITAMIN D3) 50 MCG (2000 UT) capsule TAKE 1 CAPSULE BY MOUTH EVERY DAY 90 capsule 1   Cyanocobalamin (VITAMIN B 12 PO) Take by mouth.     esomeprazole (NEXIUM) 40 MG capsule Take 1 capsule (40 mg total) by mouth daily. 90 capsule 1   HUMIRA PEN 40 MG/0.4ML PNKT SMARTSIG:40 Milligram(s) SUB-Q Every 2 Weeks     leflunomide (ARAVA) 20 MG tablet Take 20 mg by mouth daily.     methocarbamol (ROBAXIN) 500 MG tablet Take 1 tablet (500 mg total) by mouth 2 (two) times daily. 180 tablet 0   rivaroxaban (XARELTO) 10 MG TABS tablet Take 1 tablet (10 mg total) by mouth daily. (Patient taking differently: Take 10 mg by mouth daily. Only for travel) 90 tablet 1   Vitamin D, Ergocalciferol, (DRISDOL) 1.25 MG (50000 UNIT) CAPS capsule Take 1 capsule (50,000 Units total) by mouth every 7 (seven) days. 12 capsule 0   triamcinolone cream (KENALOG) 0.5 % APPLY TO AFFECTED AREA TWICE A DAY 30 g 0    No  results found for this or any previous visit (from the past 65 hour(s)).  No results found.    Blood pressure 119/66, pulse (!) 58, temperature 98.2 F (36.8 C), temperature source Oral, resp. rate 15, height 5\' 3"  (1.6 m), weight 105.8 kg, SpO2 99 %.  General appearance: alert, cooperative, and appears stated age Head: Normocephalic, without obvious abnormality, atraumatic Neck: supple, symmetrical, trachea midline Extremities: Intact sensation and capillary refill all digits.  +epl/fpl/io.  No wounds.  Pulses: 2+ and symmetric Skin: Skin color, texture, turgor normal. No rashes or lesions Neurologic: Grossly normal Incision/Wound: none  Assessment/Plan Right long finger trigger digit.  Plan surgical release with possible fds excision.   Non operative and operative treatment options have been discussed with the patient and patient wishes to proceed with operative treatment. Risks, benefits, and alternatives of surgery have been discussed and the patient agrees with the plan of care.   [name] 03/28/2022, 1:19 PM

## 2022-03-29 ENCOUNTER — Encounter (HOSPITAL_BASED_OUTPATIENT_CLINIC_OR_DEPARTMENT_OTHER): Payer: Self-pay | Admitting: Orthopedic Surgery

## 2022-03-29 NOTE — Addendum Note (Signed)
Addendum  created 03/29/22 1150 by Treyvonne Tata S, CRNA   Charge Capture section accepted    

## 2022-05-15 ENCOUNTER — Other Ambulatory Visit: Payer: Self-pay | Admitting: Internal Medicine

## 2022-05-15 DIAGNOSIS — M0579 Rheumatoid arthritis with rheumatoid factor of multiple sites without organ or systems involvement: Secondary | ICD-10-CM

## 2022-06-04 ENCOUNTER — Other Ambulatory Visit: Payer: Self-pay | Admitting: Internal Medicine

## 2022-06-04 DIAGNOSIS — I10 Essential (primary) hypertension: Secondary | ICD-10-CM

## 2022-07-31 IMAGING — MG MM DIGITAL SCREENING BILAT W/ TOMO AND CAD
8 of 14 series · 8 of 40 positions shown · non-contrast
Comparison: Previous exam(s).

CLINICAL DATA: Screening.

EXAM:
DIGITAL SCREENING BILATERAL MAMMOGRAM WITH TOMOSYNTHESIS AND CAD
TECHNIQUE: Bilateral screening digital craniocaudal and mediolateral oblique
mammograms were obtained. Bilateral screening digital breast
tomosynthesis was performed. The images were evaluated with
computer-aided detection.

[L CC synth-2D (1 of 2)]
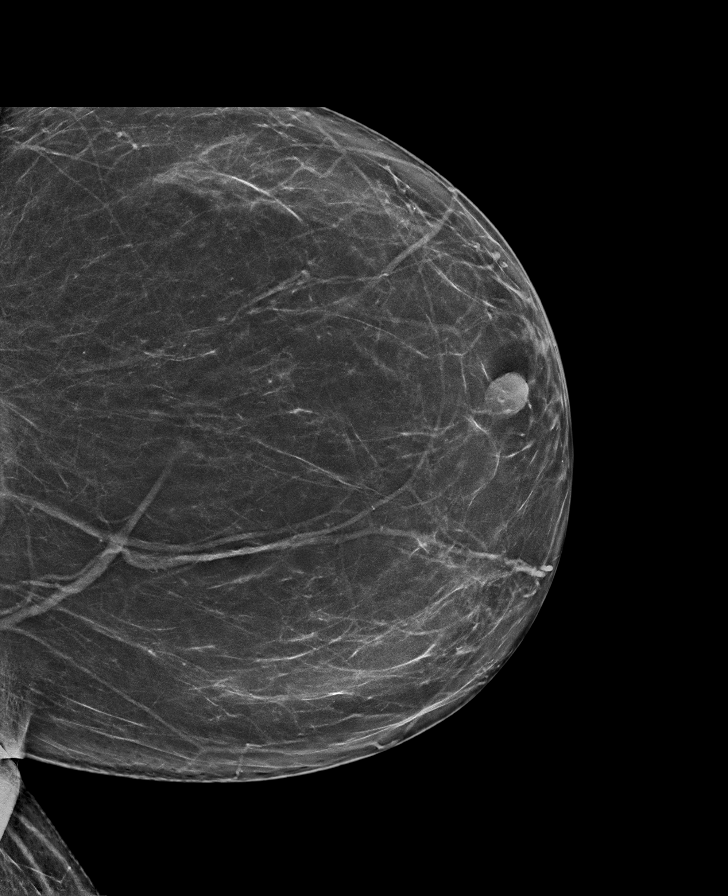

[R MLO synth-2D (1 of 2)]
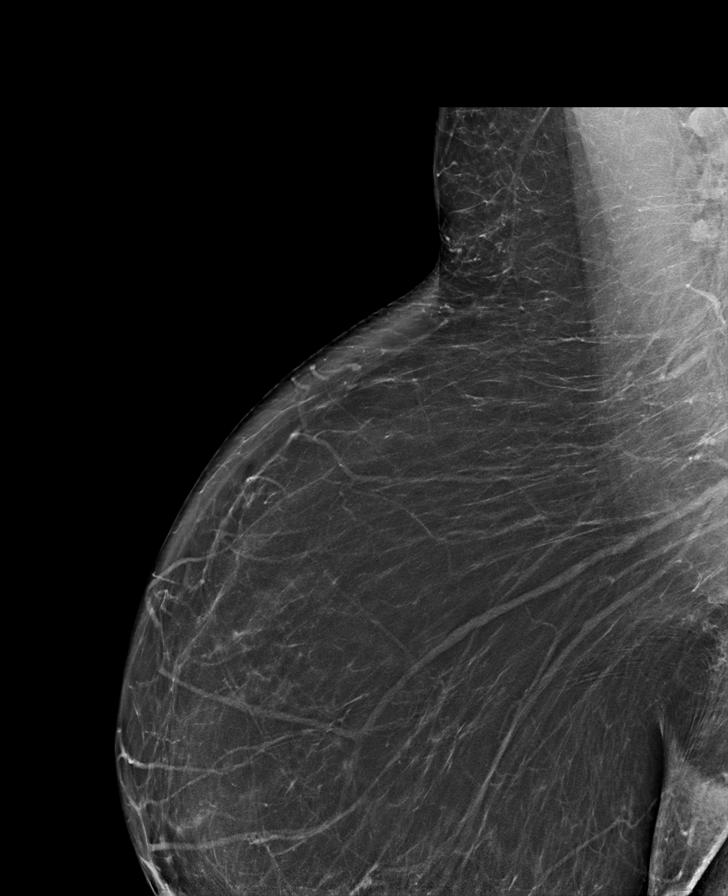

[R MLO synth-2D (2 of 2)]
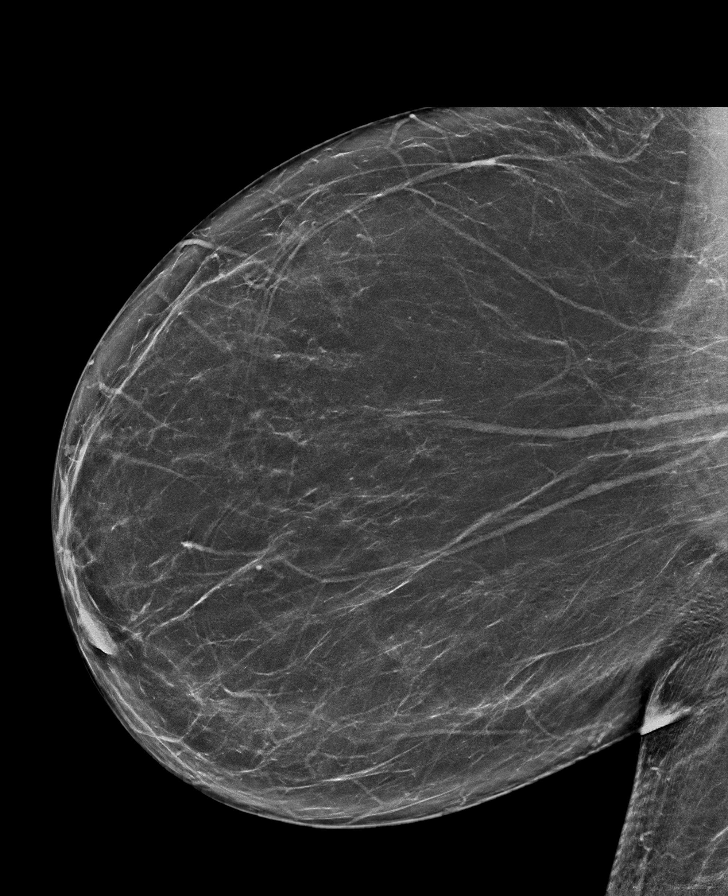

[L MLO synth-2D]
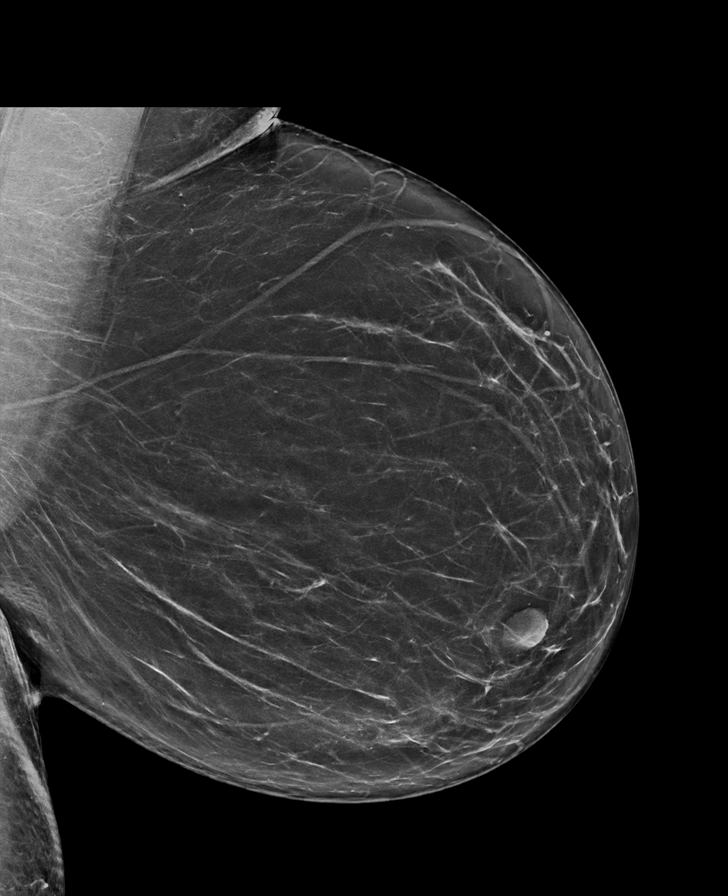

[R CC synth-2D (1 of 2)]
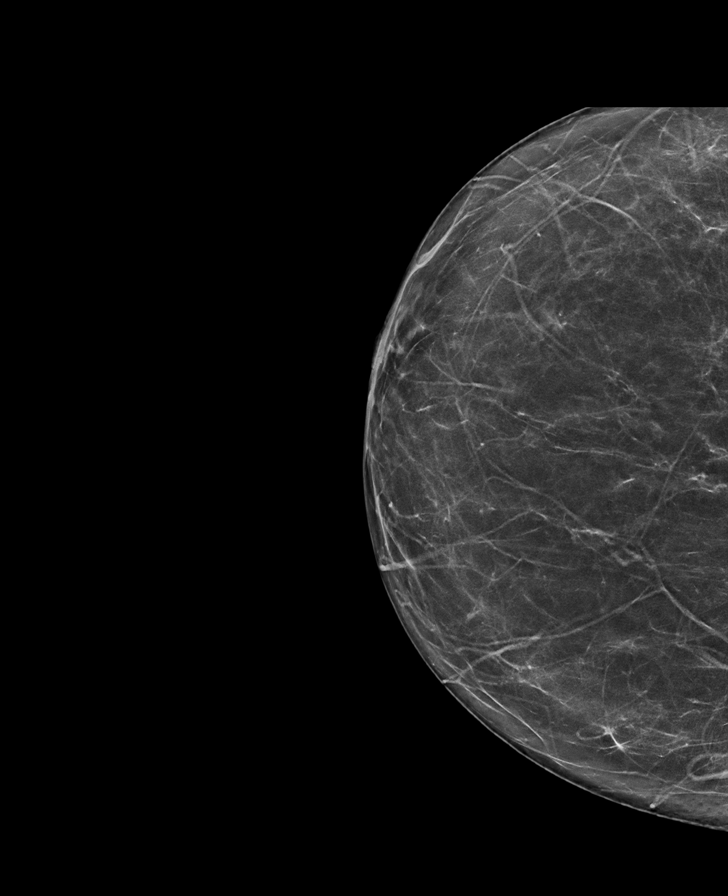

[L CC synth-2D (2 of 2)]
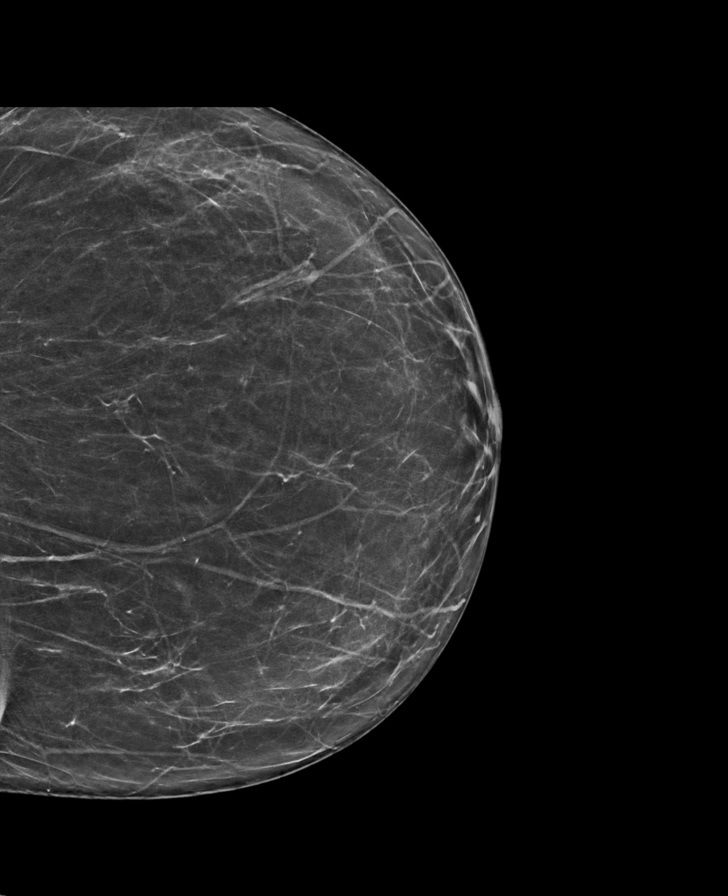

[R CC synth-2D (2 of 2)]
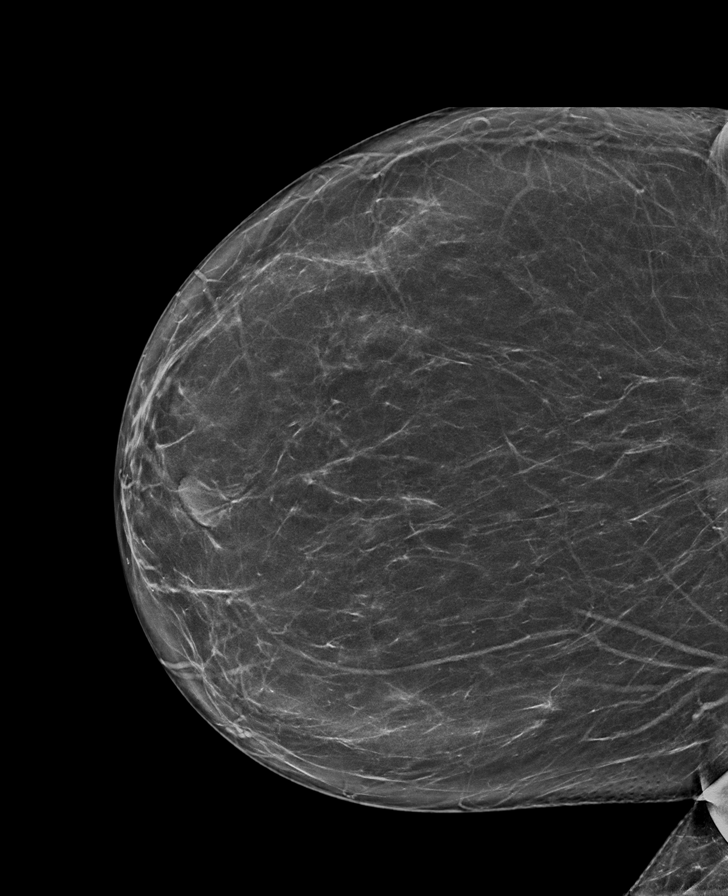

[L CC tomo · tomo slice 39/77.0]
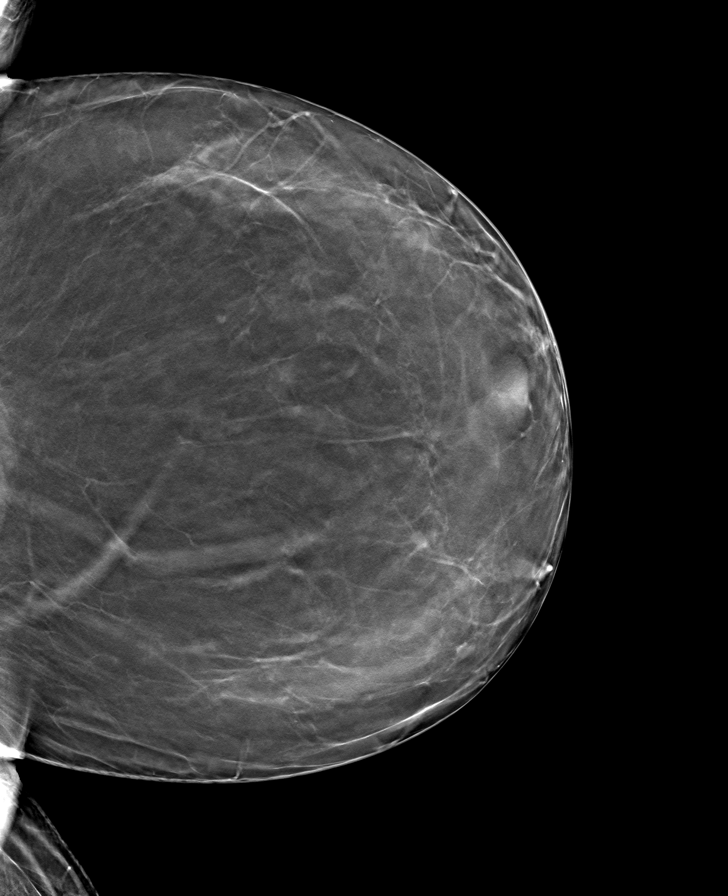

[8 of 40 positions shown; findings below may reference images not displayed]

ACR Breast Density Category b: There are scattered areas of
fibroglandular density.
FINDINGS: There are no findings suspicious for malignancy.
IMPRESSION: No mammographic evidence of malignancy. A result letter of this
screening mammogram will be mailed directly to the patient.

RECOMMENDATION:
Screening mammogram in one year. (Code:51-O-LD2)

BI-RADS CATEGORY  1: Negative.

## 2022-08-21 ENCOUNTER — Other Ambulatory Visit: Payer: Self-pay | Admitting: Internal Medicine

## 2022-08-21 DIAGNOSIS — I10 Essential (primary) hypertension: Secondary | ICD-10-CM

## 2022-08-27 ENCOUNTER — Other Ambulatory Visit: Payer: Self-pay | Admitting: Internal Medicine

## 2022-08-27 DIAGNOSIS — K219 Gastro-esophageal reflux disease without esophagitis: Secondary | ICD-10-CM

## 2022-09-04 ENCOUNTER — Ambulatory Visit: Payer: 59 | Admitting: Internal Medicine

## 2022-09-04 ENCOUNTER — Encounter: Payer: Self-pay | Admitting: Internal Medicine

## 2022-09-04 ENCOUNTER — Other Ambulatory Visit: Payer: Self-pay | Admitting: Internal Medicine

## 2022-09-04 DIAGNOSIS — I10 Essential (primary) hypertension: Secondary | ICD-10-CM

## 2022-09-04 DIAGNOSIS — E7800 Pure hypercholesterolemia, unspecified: Secondary | ICD-10-CM | POA: Diagnosis not present

## 2022-09-04 DIAGNOSIS — D6859 Other primary thrombophilia: Secondary | ICD-10-CM

## 2022-09-04 DIAGNOSIS — M0579 Rheumatoid arthritis with rheumatoid factor of multiple sites without organ or systems involvement: Secondary | ICD-10-CM

## 2022-09-04 MED ORDER — ATENOLOL 50 MG PO TABS
50.0000 mg | ORAL_TABLET | Freq: Every day | ORAL | 1 refills | Status: AC
Start: 2022-09-04 — End: ?

## 2022-09-04 MED ORDER — AMLODIPINE BESYLATE 5 MG PO TABS: 5.0000 mg | ORAL_TABLET | Freq: Every day | ORAL | 1 refills | Status: AC

## 2022-09-04 MED ORDER — RIVAROXABAN 10 MG PO TABS
10.0000 mg | ORAL_TABLET | Freq: Every day | ORAL | 1 refills | Status: AC
Start: 2022-09-04 — End: ?

## 2022-09-04 MED ORDER — ATORVASTATIN CALCIUM 20 MG PO TABS: 20.0000 mg | ORAL_TABLET | Freq: Every day | ORAL | 1 refills | Status: DC

## 2022-09-04 NOTE — Progress Notes (Unsigned)
Subjective:  Patient ID: Angel Lee, female    DOB: January 30, 1961  Age: 62 y.o. MRN: [id number]  CC: Hypertension and Osteoarthritis   HPI Angel Lee presents for f/up -  Her BP has been well controlled. She is active and denies DOE, CP, SOB, edema.  Outpatient Medications Prior to Visit  Medication Sig Dispense Refill   Cholecalciferol (VITAMIN D3) 50 MCG (2000 UT) capsule TAKE 1 CAPSULE BY MOUTH EVERY DAY 90 capsule 1   Cyanocobalamin (VITAMIN B 12 PO) Take by mouth.     esomeprazole (NEXIUM) 40 MG capsule TAKE 1 CAPSULE (40 MG TOTAL) BY MOUTH DAILY. 90 capsule 1   leflunomide (ARAVA) 20 MG tablet Take 20 mg by mouth daily.     Vitamin D, Ergocalciferol, (DRISDOL) 1.25 MG (50000 UNIT) CAPS capsule Take 1 capsule (50,000 Units total) by mouth every 7 (seven) days. 12 capsule 0   amLODipine (NORVASC) 5 MG tablet TAKE 1 TABLET (5 MG TOTAL) BY MOUTH DAILY. 90 tablet 1   atenolol (TENORMIN) 50 MG tablet Take 1 tablet (50 mg total) by mouth daily. 90 tablet 1   atorvastatin (LIPITOR) 20 MG tablet Take 1 tablet (20 mg total) by mouth daily. 90 tablet 1   celecoxib (CELEBREX) 200 MG capsule Take 1 capsule (200 mg total) by mouth daily. 90 capsule 2   methocarbamol (ROBAXIN) 500 MG tablet TAKE 1 TABLET BY MOUTH TWICE A DAY 180 tablet 0   rivaroxaban (XARELTO) 10 MG TABS tablet Take 1 tablet (10 mg total) by mouth daily. (Patient taking differently: Take 10 mg by mouth daily. Only for travel) 90 tablet 1   triamcinolone cream (KENALOG) 0.5 % APPLY TO AFFECTED AREA TWICE A DAY 30 g 0   HUMIRA PEN 40 MG/0.4ML PNKT SMARTSIG:40 Milligram(s) SUB-Q Every 2 Weeks     traMADol (ULTRAM) 50 MG tablet 1-2 tabs PO q6 hours prn pain 20 tablet 0   No facility-administered medications prior to visit.    ROS Review of Systems  Constitutional: Negative.  Negative for diaphoresis and fatigue.  HENT: Negative.    Eyes: Negative.   Respiratory: Negative.  Negative for cough, chest tightness,  shortness of breath and wheezing.   Cardiovascular:  Negative for chest pain, palpitations and leg swelling.  Gastrointestinal:  Negative for abdominal pain, constipation, diarrhea, nausea and vomiting.  Endocrine: Negative.   Genitourinary: Negative.  Negative for difficulty urinating.  Musculoskeletal:  Positive for arthralgias. Negative for back pain and myalgias.  Neurological: Negative.  Negative for dizziness and weakness.  Hematological:  Negative for adenopathy. Does not bruise/bleed easily.  Psychiatric/Behavioral: Negative.      Objective:  BP 134/84 (BP Location: Left Arm, Patient Position: Sitting, Cuff Size: Large)   Pulse 69   Temp 97.6 F (36.4 C) (Oral)   Ht 5\' 3"  (1.6 m)   Wt 233 lb (105.7 kg)   SpO2 98%   BMI 41.27 kg/m   BP Readings from Last 3 Encounters:  09/04/22 134/84  03/28/22 (!) 138/96  02/27/22 114/62    Wt Readings from Last 3 Encounters:  09/04/22 233 lb (105.7 kg)  03/28/22 233 lb 4 oz (105.8 kg)  02/27/22 237 lb (107.5 kg)    Physical Exam Vitals reviewed.  Constitutional:      Appearance: She is not ill-appearing.  HENT:     Nose: Nose normal.     Mouth/Throat:     Mouth: Mucous membranes are moist.  Eyes:     General: No scleral  icterus.    Conjunctiva/sclera: Conjunctivae normal.  Cardiovascular:     Rate and Rhythm: Normal rate and regular rhythm.     Heart sounds: No murmur heard. Pulmonary:     Effort: Pulmonary effort is normal.     Breath sounds: No stridor. No wheezing, rhonchi or rales.  Abdominal:     General: Abdomen is flat.     Palpations: There is no mass.     Tenderness: There is no abdominal tenderness. There is no guarding.     Hernia: No hernia is present.  Musculoskeletal:        General: Normal range of motion.     Cervical back: Neck supple.     Right lower leg: No edema.     Left lower leg: No edema.  Lymphadenopathy:     Cervical: No cervical adenopathy.  Skin:    General: Skin is warm and dry.   Neurological:     General: No focal deficit present.     Mental Status: She is alert.  Psychiatric:        Mood and Affect: Mood normal.        Behavior: Behavior normal.     Lab Results  Component Value Date   WBC 3.7 (L) 02/27/2022   HGB 14.5 02/27/2022   HCT 44.2 02/27/2022   PLT 203.0 02/27/2022   GLUCOSE 101 (H) 02/27/2022   CHOL 257 (H) 02/27/2022   TRIG 132.0 02/27/2022   HDL 90.30 02/27/2022   LDLCALC 140 (H) 02/27/2022   ALT 12 02/27/2022   AST 18 02/27/2022   NA 143 02/27/2022   K 4.9 02/27/2022   CL 105 02/27/2022   CREATININE 0.96 02/27/2022   BUN 14 02/27/2022   CO2 30 02/27/2022   TSH 1.40 02/27/2022   HGBA1C 5.6 03/31/2021    No results found.  Assessment & Plan:   Primary hypertension- Her BP is well controlled. -     amLODIPine Besylate; Take 1 tablet (5 mg total) by mouth daily.  Dispense: 90 tablet; Refill: 1 -     Atenolol; Take 1 tablet (50 mg total) by mouth daily.  Dispense: 90 tablet; Refill: 1  Pure hypercholesterolemia - LDL goal achieved. Doing well on the statin  -     Atorvastatin Calcium; Take 1 tablet (20 mg total) by mouth daily.  Dispense: 90 tablet; Refill: 1  Hypercoagulable state, primary - Will continue the DOAC. -     Rivaroxaban; Take 1 tablet (10 mg total) by mouth daily. Only for travel  Dispense: 90 tablet; Refill: 1     Follow-up: Return in about 6 months (around 03/06/2023).  Sanda Linger, MD

## 2022-09-04 NOTE — Patient Instructions (Signed)
Hypertension, Adult High blood pressure (hypertension) is when the force of blood pumping through the arteries is too strong. The arteries are the blood vessels that carry blood from the heart throughout the body. Hypertension forces the heart to work harder to pump blood and may cause arteries to become narrow or stiff. Untreated or uncontrolled hypertension can lead to a heart attack, heart failure, a stroke, kidney disease, and other problems. A blood pressure reading consists of a higher number over a lower number. Ideally, your blood pressure should be below 120/80. The first ("top") number is called the systolic pressure. It is a measure of the pressure in your arteries as your heart beats. The second ("bottom") number is called the diastolic pressure. It is a measure of the pressure in your arteries as the heart relaxes. What are the causes? The exact cause of this condition is not known. There are some conditions that result in high blood pressure. What increases the risk? Certain factors may make you more likely to develop high blood pressure. Some of these risk factors are under your control, including: Smoking. Not getting enough exercise or physical activity. Being overweight. Having too much fat, sugar, calories, or salt (sodium) in your diet. Drinking too much alcohol. Other risk factors include: Having a personal history of heart disease, diabetes, high cholesterol, or kidney disease. Stress. Having a family history of high blood pressure and high cholesterol. Having obstructive sleep apnea. Age. The risk increases with age. What are the signs or symptoms? High blood pressure may not cause symptoms. Very high blood pressure (hypertensive crisis) may cause: Headache. Fast or irregular heartbeats (palpitations). Shortness of breath. Nosebleed. Nausea and vomiting. Vision changes. Severe chest pain, dizziness, and seizures. How is this diagnosed? This condition is diagnosed by  measuring your blood pressure while you are seated, with your arm resting on a flat surface, your legs uncrossed, and your feet flat on the floor. The cuff of the blood pressure monitor will be placed directly against the skin of your upper arm at the level of your heart. Blood pressure should be measured at least twice using the same arm. Certain conditions can cause a difference in blood pressure between your right and left arms. If you have a high blood pressure reading during one visit or you have normal blood pressure with other risk factors, you may be asked to: Return on a different day to have your blood pressure checked again. Monitor your blood pressure at home for 1 week or longer. If you are diagnosed with hypertension, you may have other blood or imaging tests to help your health care provider understand your overall risk for other conditions. How is this treated? This condition is treated by making healthy lifestyle changes, such as eating healthy foods, exercising more, and reducing your alcohol intake. You may be referred for counseling on a healthy diet and physical activity. Your health care provider may prescribe medicine if lifestyle changes are not enough to get your blood pressure under control and if: Your systolic blood pressure is above 130. Your diastolic blood pressure is above 80. Your personal target blood pressure may vary depending on your medical conditions, your age, and other factors. Follow these instructions at home: Eating and drinking  Eat a diet that is high in fiber and potassium, and low in sodium, added sugar, and fat. An example of this eating plan is called the DASH diet. DASH stands for Dietary Approaches to Stop Hypertension. To eat this way: Eat   plenty of fresh fruits and vegetables. Try to fill one half of your plate at each meal with fruits and vegetables. Eat whole grains, such as whole-wheat pasta, brown rice, or whole-grain bread. Fill about one  fourth of your plate with whole grains. Eat or drink low-fat dairy products, such as skim milk or low-fat yogurt. Avoid fatty cuts of meat, processed or cured meats, and poultry with skin. Fill about one fourth of your plate with lean proteins, such as fish, chicken without skin, beans, eggs, or tofu. Avoid pre-made and processed foods. These tend to be higher in sodium, added sugar, and fat. Reduce your daily sodium intake. Many people with hypertension should eat less than 1,500 mg of sodium a day. Do not drink alcohol if: Your health care provider tells you not to drink. You are pregnant, may be pregnant, or are planning to become pregnant. If you drink alcohol: Limit how much you have to: 0-1 drink a day for women. 0-2 drinks a day for men. Know how much alcohol is in your drink. In the [place] one drink equals one 12 oz bottle of beer (355 mL), one 5 oz glass of wine (148 mL), or one 1 oz glass of hard liquor (44 mL). Lifestyle  Work with your health care provider to maintain a healthy body weight or to lose weight. Ask what an ideal weight is for you. Get at least 30 minutes of exercise that causes your heart to beat faster (aerobic exercise) most days of the week. Activities may include walking, swimming, or biking. Include exercise to strengthen your muscles (resistance exercise), such as Pilates or lifting weights, as part of your weekly exercise routine. Try to do these types of exercises for 30 minutes at least 3 days a week. Do not use any products that contain nicotine or tobacco. These products include cigarettes, chewing tobacco, and vaping devices, such as e-cigarettes. If you need help quitting, ask your health care provider. Monitor your blood pressure at home as told by your health care provider. Keep all follow-up visits. This is important. Medicines Take over-the-counter and prescription medicines only as told by your health care provider. Follow directions carefully. Blood  pressure medicines must be taken as prescribed. Do not skip doses of blood pressure medicine. Doing this puts you at risk for problems and can make the medicine less effective. Ask your health care provider about side effects or reactions to medicines that you should watch for. Contact a health care provider if you: Think you are having a reaction to a medicine you are taking. Have headaches that keep coming back (recurring). Feel dizzy. Have swelling in your ankles. Have trouble with your vision. Get help right away if you: Develop a severe headache or confusion. Have unusual weakness or numbness. Feel faint. Have severe pain in your chest or abdomen. Vomit repeatedly. Have trouble breathing. These symptoms may be an emergency. Get help right away. Call 911. Do not wait to see if the symptoms will go away. Do not drive yourself to the hospital. Summary Hypertension is when the force of blood pumping through your arteries is too strong. If this condition is not controlled, it may put you at risk for serious complications. Your personal target blood pressure may vary depending on your medical conditions, your age, and other factors. For most people, a normal blood pressure is less than 120/80. Hypertension is treated with lifestyle changes, medicines, or a combination of both. Lifestyle changes include losing weight, eating a healthy,   low-sodium diet, exercising more, and limiting alcohol. This information is not intended to replace advice given to you by your health care provider. Make sure you discuss any questions you have with your health care provider. Document Revised: 03/22/2021 Document Reviewed: 03/22/2021 [employer] Patient Education  2023 [employer]  

## 2022-09-10 ENCOUNTER — Other Ambulatory Visit: Payer: Self-pay | Admitting: Internal Medicine

## 2022-09-10 DIAGNOSIS — E559 Vitamin D deficiency, unspecified: Secondary | ICD-10-CM

## 2023-01-15 ENCOUNTER — Other Ambulatory Visit: Payer: Self-pay | Admitting: Internal Medicine

## 2023-01-15 DIAGNOSIS — E7800 Pure hypercholesterolemia, unspecified: Secondary | ICD-10-CM

## 2023-03-02 ENCOUNTER — Other Ambulatory Visit: Payer: Self-pay | Admitting: Internal Medicine

## 2023-03-02 DIAGNOSIS — I10 Essential (primary) hypertension: Secondary | ICD-10-CM

## 2023-03-04 ENCOUNTER — Other Ambulatory Visit: Payer: Self-pay | Admitting: Internal Medicine

## 2023-03-04 DIAGNOSIS — K219 Gastro-esophageal reflux disease without esophagitis: Secondary | ICD-10-CM

## 2023-03-20 ENCOUNTER — Other Ambulatory Visit: Payer: Self-pay | Admitting: Internal Medicine

## 2023-03-20 DIAGNOSIS — E559 Vitamin D deficiency, unspecified: Secondary | ICD-10-CM

## 2023-06-13 ENCOUNTER — Other Ambulatory Visit: Payer: Self-pay | Admitting: Internal Medicine

## 2023-06-13 DIAGNOSIS — I10 Essential (primary) hypertension: Secondary | ICD-10-CM

## 2023-07-05 ENCOUNTER — Other Ambulatory Visit: Payer: Self-pay | Admitting: Internal Medicine

## 2023-07-05 DIAGNOSIS — E7800 Pure hypercholesterolemia, unspecified: Secondary | ICD-10-CM

## 2023-09-30 ENCOUNTER — Other Ambulatory Visit: Payer: Self-pay | Admitting: Internal Medicine

## 2023-09-30 DIAGNOSIS — E7800 Pure hypercholesterolemia, unspecified: Secondary | ICD-10-CM

## 2023-12-19 ENCOUNTER — Other Ambulatory Visit: Payer: Self-pay | Admitting: Internal Medicine

## 2023-12-19 DIAGNOSIS — E559 Vitamin D deficiency, unspecified: Secondary | ICD-10-CM
